# Patient Record
Sex: Female | Born: 1935 | Race: White | Hispanic: No | Marital: Married | Smoking: Never smoker
Health system: Southern US, Community
[De-identification: ages and names within clinical notes are randomized; demographics above are authoritative.]

## PROBLEM LIST (undated history)

## (undated) DIAGNOSIS — I639 Cerebral infarction, unspecified: Secondary | ICD-10-CM

## (undated) DIAGNOSIS — C801 Malignant (primary) neoplasm, unspecified: Secondary | ICD-10-CM

## (undated) DIAGNOSIS — M1990 Unspecified osteoarthritis, unspecified site: Secondary | ICD-10-CM

## (undated) HISTORY — PX: CAROTID ENDARTERECTOMY: SUR193

## (undated) HISTORY — PX: OTHER SURGICAL HISTORY: SHX169

## (undated) HISTORY — DX: Unspecified osteoarthritis, unspecified site: M19.90

## (undated) HISTORY — DX: Cerebral infarction, unspecified: I63.9

## (undated) HISTORY — DX: Malignant (primary) neoplasm, unspecified: C80.1

## (undated) HISTORY — PX: MASTECTOMY: SHX3

## (undated) HISTORY — PX: TUBAL LIGATION: SHX77

---

## 1999-11-06 ENCOUNTER — Encounter: Admission: RE | Admit: 1999-11-06 | Discharge: 2000-02-04 | Payer: Self-pay | Admitting: Radiation Oncology

## 2008-02-12 ENCOUNTER — Ambulatory Visit: Payer: Self-pay | Admitting: *Deleted

## 2008-03-04 ENCOUNTER — Ambulatory Visit: Payer: Self-pay | Admitting: *Deleted

## 2008-03-10 ENCOUNTER — Inpatient Hospital Stay (HOSPITAL_COMMUNITY): Admission: RE | Admit: 2008-03-10 | Discharge: 2008-03-11 | Payer: Self-pay | Admitting: *Deleted

## 2008-03-10 ENCOUNTER — Encounter (INDEPENDENT_AMBULATORY_CARE_PROVIDER_SITE_OTHER): Payer: Self-pay | Admitting: *Deleted

## 2008-03-10 ENCOUNTER — Ambulatory Visit: Payer: Self-pay | Admitting: *Deleted

## 2008-04-08 ENCOUNTER — Ambulatory Visit: Payer: Self-pay | Admitting: *Deleted

## 2008-05-27 ENCOUNTER — Ambulatory Visit: Payer: Self-pay | Admitting: *Deleted

## 2008-09-09 ENCOUNTER — Ambulatory Visit: Payer: Self-pay | Admitting: *Deleted

## 2008-09-16 ENCOUNTER — Encounter: Admission: RE | Admit: 2008-09-16 | Discharge: 2008-09-16 | Payer: Self-pay | Admitting: *Deleted

## 2008-09-16 ENCOUNTER — Ambulatory Visit: Payer: Self-pay | Admitting: *Deleted

## 2008-11-04 ENCOUNTER — Ambulatory Visit: Payer: Self-pay | Admitting: *Deleted

## 2008-11-17 ENCOUNTER — Ambulatory Visit: Payer: Self-pay | Admitting: *Deleted

## 2008-11-17 ENCOUNTER — Inpatient Hospital Stay (HOSPITAL_COMMUNITY): Admission: RE | Admit: 2008-11-17 | Discharge: 2008-11-18 | Payer: Self-pay | Admitting: *Deleted

## 2008-12-16 ENCOUNTER — Ambulatory Visit: Payer: Self-pay | Admitting: *Deleted

## 2009-03-22 ENCOUNTER — Ambulatory Visit: Payer: Self-pay | Admitting: Vascular Surgery

## 2009-10-12 ENCOUNTER — Ambulatory Visit: Payer: Self-pay | Admitting: Vascular Surgery

## 2010-07-04 ENCOUNTER — Ambulatory Visit: Payer: Self-pay | Admitting: Internal Medicine

## 2010-07-11 ENCOUNTER — Ambulatory Visit (HOSPITAL_COMMUNITY): Admission: RE | Admit: 2010-07-11 | Discharge: 2010-07-11 | Payer: Self-pay | Admitting: Internal Medicine

## 2010-07-11 ENCOUNTER — Ambulatory Visit: Payer: Self-pay | Admitting: Internal Medicine

## 2010-09-04 NOTE — Consult Note (Signed)
[name], [name]                ACCOUNT NO.:  [id number]  MEDICAL RECORD NO.:  [id number]          PATIENT TYPE:  AMB  LOCATION:  DAY                           FACILITY:  [facility]  PHYSICIAN:  Lionel December, M.D.    DATE OF BIRTH:  [birth year]  DATE OF CONSULTATION: DATE OF DISCHARGE:  07/11/2010                                CONSULTATION   REASON FOR CONSULTATION:  Heme-positive stool.  Abigail [name] is an 77 year old female, referral from Dr. Sherril Croon for heme- positive stool card on May 23, 2010.  Ms. Hakanson states her weight has been stable for the last 6 months.  She occasionally has acid reflux about 3 times a week.  Her appetite is good.  She also complains of dysphagia.  Foods are lodging especially if she is upset.  No particular foods.  There are no foods in particular that will lodge.  To pass the bolus, she will wait and it would eventually go down or she will drink water and it will pass into her stomach.  She denies any abdominal pain. She has one bowel movement a day.  Stools are dark gray and sometimes they are normal brown.  There is no pain with defecation.  She has seen dark stools in the past.  She denies black, tarry stools.  She denies seeing any bright red rectal bleeding.  She does admit to taking Advil up to 8 a day on most days on a regular basis.  Her last colonoscopy was in 2003 by Dr. Cleotis Nipper, which was normal she reports.  She did, however, have a small polyp.  I will try to obtain the colonoscopy report.  She does complain that her energy level has been down.  ALLERGIES:  She is allergic to [facility] and SULFA.  PAST SURGICAL HISTORY:  She had a lumpectomy in 2000 for breast cancer and in 2004, she underwent a total mastectomy and this was to her left breast.  She has had a tubal ligation, hysterectomy, and she has had disk surgery.  MEDICAL HISTORY:  High cholesterol.  FAMILY HISTORY:  Her mother is deceased from natural causes.  Her father is  deceased from rectal cancer at age 75.  Four sisters, 2 were deceased, one is deceased from uterine cancer and one is deceased from complications of Alzheimer's.  Two sisters are alive and both have pacemakers.  She is married.  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  She does not smoke, drink, or do drugs.  She has 3 children in good health.  HOME MEDICATIONS:  Include aspirin 81 mg a day, Mentax one a day, Allegra 180 mg a day.  OBJECTIVE:  VITAL SIGNS:  Her weight is 108.7, her height is 5 feet 2 inches, her temperature is 97.1, her blood pressure is 132/70, and her pulse is 84. HEENT:  She has natural teeth.  Her oral mucosa is moist.  There is no lesions.  Her conjunctivae is pink.  Her sclerae are anicteric. NECK:  Her thyroid is normal.  There is no cervical lymphadenopathy. LUNGS:  Clear. HEART:  Regular rate and  rhythm. EXTREMITIES:  There is no edema to her extremities.  Her stool was guaiac-negative today and brown.  ASSESSMENT:  Ms. Abigail [name] is an 77 year old female presenting with a history of heme-positive stools.  She has also been taking an excessive amount of Advil.  She also complains of dysphagia and acid reflux. Peptic ulcer disease and an esophageal stricture needs to be ruled out. Also a colonic neoplasm needs to be rule out as well as a colonic ulcer.  RECOMMENDATIONS:  We will get a CBC on her today.  We will get the colonoscopy report from 2003.  We will schedule her for an EGD/ED and a colonoscopy with Dr. Karilyn Cota in the near future and I have discussed the risks and benefits with the patient and she verbalizes an understanding.    ______________________________ Dorene Ar, NP   ______________________________ Lionel December, M.D.    TS/MEDQ  D:  07/04/2010  T:  07/05/2010  Job:  [id number]  cc:   Dr. Sherril Croon  Electronically Signed by Dorene Ar PA on 08/21/2010 10:14:21 AM Electronically Signed by Lionel December M.D. on  09/04/2010 12:00:28 PM

## 2010-10-24 LAB — H. PYLORI ANTIBODY, IGG: H Pylori IgG: <0.4 {ISR}

## 2010-11-03 ENCOUNTER — Other Ambulatory Visit (INDEPENDENT_AMBULATORY_CARE_PROVIDER_SITE_OTHER): Payer: Medicare PPO

## 2010-11-03 ENCOUNTER — Ambulatory Visit (INDEPENDENT_AMBULATORY_CARE_PROVIDER_SITE_OTHER): Payer: Medicare PPO

## 2010-11-03 DIAGNOSIS — I6529 Occlusion and stenosis of unspecified carotid artery: Secondary | ICD-10-CM

## 2010-11-03 DIAGNOSIS — Z48812 Encounter for surgical aftercare following surgery on the circulatory system: Secondary | ICD-10-CM

## 2010-11-03 NOTE — H&P (Signed)
HISTORY AND PHYSICAL EXAMINATION  November 03, 2010  Re:  [place], [place] E                DOB:  January 25, 1925  HISTORY OF PRESENT ILLNESS:  The patient is an 77 year old female who presents today for routine followup of carotid duplex.  She states she feels well and is without complaint.  The patient is status post left brain stroke in 2009 with subsequent left carotid endarterectomy in July 2009 as well as left carotid stenting in April of 2010.  The patient has continued to do well from a neurologic standpoint throughout this time. She denies amaurosis fugax, dysarthria, dysphagia and hemiparesis.  The patient also denies claudication symptoms, chest pain, shortness of breath, nausea, vomiting, diarrhea, constipation and difficulty voiding. The patient's past medical history is significant for breast cancer, arthritis and CVA.  PAST MEDICAL HISTORY: 1. Breast cancer status post left mastectomy. 2. CVA. 3. Arthritis. 4. Spinal disk surgery. 5. Tubal ligation. 6. Left carotid endarterectomy with subsequent left carotid stenting.  ALLERGIES:  Mycin drugs which causes pruritus.  MEDICATIONS: 1. Aspirin 81 mg daily. 2. Multivitamin daily. 3. Allegra daily.  SOCIAL HISTORY:  The patient is married with 3 children.  She is retired.  She does not smoke and does not drink alcohol.  FAMILY HISTORY: 1. Coronary artery disease. 2. Lung cancer. 3. Ovarian cancer. 4. CVA.  REVIEW OF SYSTEMS:  Please see HPI for pertinent positives and negatives, otherwise complete review of systems is negative.  PHYSICAL EXAM:  Vital signs:  Blood pressure 187/85, heart rate 77, respirations 16, O2 saturation 98% on room air.  General:  This is a well-nourished female in no acute distress.  HEENT:  PERRLA.  EOMI. Conjunctivae normal.  Neck:  No bruits are noted.  Lungs:  Clear to auscultation.  Cardiovascular:  Regular rate and rhythm.  Abdomen: Soft, nontender with active bowel  sounds.  Musculoskeletal:  No major deformities or cyanosis are noted.  There are 2+ palpable pulses present in the carotids, radials, femorals and dorsalis pedis bilaterally. Motor and sensation are intact in the bilateral upper and lower extremities.  Neurological:  No focal weakness or paresthesias.  Skin: No ulcers or rashes.  IMAGING:  Carotid duplex performed on 11/03/2010 revealed a 1% to 39% right internal carotid artery stenosis and a widely patent left carotid stent with no evidence of hyperplasia or restenosis.  Bilateral vertebral arteries are within normal limits.  This study is unchanged from the previous evaluation approximately 1 year ago.  ASSESSMENT:  Extracranial cerebrovascular occlusive disease.  PLAN:  The patient is doing well from a carotid standpoint.  She will follow up in 1 year with repeat carotid duplex per protocol.  The patient is to call with any questions, issues or problems in the interim.  Pecola Leisure, [place]  Fransisco Hertz, MD Electronically Signed  AY/MEDQ  D:  11/03/2010  T:  11/03/2010  Job:  [id number]

## 2010-11-11 NOTE — Procedures (Unsigned)
CAROTID DUPLEX EXAM  INDICATION:  Follow up left carotid stent placed 11/2008.  HISTORY: Diabetes:  No. Cardiac:  No. Hypertension:  No. Smoking:  No. Previous Surgery:  Left CEA in 02/2008, left mastectomy. CV History: Amaurosis Fugax No, Paresthesias No, Hemiparesis No.                                      RIGHT             LEFT Brachial systolic pressure:         180               NA Brachial Doppler waveforms:         WNL               NA Vertebral direction of flow:        Antegrade         Antegrade DUPLEX VELOCITIES (cm/sec) CCA peak systolic                   74                94 ECA peak systolic                   69                169 ICA peak systolic                   93                107 (stent) ICA end diastolic                   23                20 (stent) PLAQUE MORPHOLOGY:                  Calcific          NA PLAQUE AMOUNT:                      Minimal           NA PLAQUE LOCATION:                    ICA               NA  IMPRESSION: 1. 1% to 39% right internal carotid artery calcific plaquing. 2. Widely patent left carotid stent without evidence of hyperplasia or     restenosis. 3. Bilateral vertebral arteries are within normal limits.  ___________________________________________ Quita Skye Hart [place], M.D.  LT/MEDQ  D:  11/03/2010  T:  11/03/2010  Job:  [id number]

## 2010-11-22 LAB — BASIC METABOLIC PANEL
CO2: 27 mEq/L (ref 19–32)
Calcium: 8.6 mg/dL (ref 8.4–10.5)
Creatinine, Ser: 0.92 mg/dL (ref 0.4–1.2)
GFR calc Af Amer: >60 mL/min (ref >60)
GFR calc non Af Amer: 59 mL/min — ABNORMAL LOW (ref >60)
Sodium: 139 mEq/L (ref 135–145)

## 2010-11-22 LAB — POCT I-STAT, CHEM 8
BUN: 20 mg/dL (ref 6–23)
Calcium, Ion: 1.13 mmol/L (ref 1.12–1.32)
Chloride: 103 meq/L (ref 96–112)
Creatinine, Ser: 1.2 mg/dL (ref 0.4–1.2)
Glucose, Bld: 106 mg/dL — ABNORMAL HIGH (ref 70–99)
HCT: 45 % (ref 36.0–46.0)
Hemoglobin: 15.3 g/dL — ABNORMAL HIGH (ref 12.0–15.0)
Potassium: 4.4 meq/L (ref 3.5–5.1)
Sodium: 138 meq/L (ref 135–145)
TCO2: 28 mmol/L (ref 0–100)

## 2010-11-22 LAB — CK TOTAL AND CKMB (NOT AT ARMC)
CK, MB: 0.6 ng/mL (ref 0.3–4.0)
Relative Index: INVALID (ref 0.0–2.5)
Total CK: 22 U/L (ref 7–177)

## 2010-11-22 LAB — CBC
MCHC: 33.3 g/dL (ref 30.0–36.0)
RBC: 3.46 MIL/uL — ABNORMAL LOW (ref 3.87–5.11)

## 2010-12-26 NOTE — Assessment & Plan Note (Signed)
OFFICE VISIT   Schoenfeld, Kashonda E  DOB:  08-27-1924                                       03/04/2008  KGMWN#:[id number]   The patient underwent a non-disabling left hemispheric CVA and has an  associated left carotid stenosis.  She was seen in consultation  02/12/2008.  Returns at this time with the results of CT angiogram of th  neck.  This does verify moderate left internal carotid artery stenosis  with eccentric soft plaque noted.   These findings are consistent with her presentation and it is my  recommendation she undergo left carotid endarterectomy for reduction of  stroke risk.  She is agreeable to this, scheduled for 03/10/2008 at  [facility].  She will discontinue Plavix at this time and begin  aspirin 325 mg daily.   Balinda Quails, M.D.  Electronically Signed   PGH/MEDQ  D:  03/04/2008  T:  03/05/2008  Job:  [id number]   cc:   Doreen Beam, MD

## 2010-12-26 NOTE — Assessment & Plan Note (Signed)
OFFICE VISIT   Abigail [name], Abigail [name]  DOB:  May 12, 1925                                       04/08/2008  ZOXWR#:[id number]   The patient underwent left carotid endarterectomy for reduction of  stroke risk on 03/10/2008 at [facility].  She returns today for  scheduled postoperative visit.  No major complaints.  She has noted some  weakness in the left lower lip area, this is related to some traction on  her marginal mandibular nerve.  I have reassured her that this will get  better.   Left neck incision is healing unremarkably.  She is otherwise intact.  Blood pressure is 158/89 with 102 pulse.   PLAN:  Follow up again in approximately 6 weeks to assure there is  continued improvement.   Balinda Quails, M.D.  Electronically Signed   PGH/MEDQ  D:  04/08/2008  T:  04/09/2008  Job:  [id number]   cc:   Doreen Beam, MD

## 2010-12-26 NOTE — Assessment & Plan Note (Signed)
OFFICE VISIT   [name], Abigail E  DOB:  [birth year]                                       09/09/2008  ZOXWR#:[id number]   The patient is an 77 year old female who underwent left carotid  endarterectomy in July of last year.  This was carried out due to a non-  disabling left hemispheric CVA associated with severe left ICA stenosis.   She has done well since her surgery and returned for a follow-up  evaluation with carotid Doppler.  This reveals the endarterectomy site  to be widely patent.  There is, however, an area of stenosis in the  proximal left common carotid artery with a high velocity 397 cm/sec.   This may represent a clamp injury or progression of proximal plaque.  I  have ordered a CT angiogram of the neck to be carried out and return  visit to see me to further evaluate this and make recommendations at  that time.   Balinda Quails, M.D.  Electronically Signed   PGH/MEDQ  D:  09/09/2008  T:  09/10/2008  Job:  [id number]   cc:   Doreen Beam, MD

## 2010-12-26 NOTE — Op Note (Signed)
NAMELINNAE, RASOOL                ACCOUNT NO.:  [id number]   MEDICAL RECORD NO.:  [id number]          PATIENT TYPE:  INP   LOCATION:  2807                         FACILITY:  [facility]   PHYSICIAN:  Balinda Quails, M.D.    DATE OF BIRTH:  10/12/1924   DATE OF PROCEDURE:  DATE OF DISCHARGE:                               OPERATIVE REPORT   SURGEON:  Balinda Quails, MD   ASSISTANT:  Cristy Hilts. Jacinto Halim, MD   PREOPERATIVE DIAGNOSIS:  Left carotid stenosis.   POSTOPERATIVE DIAGNOSIS:  Left carotid stenosis.   PROCEDURE:  1. Arteriogram.  2. Left carotid stent with distal embolic protection.   ACCESS:  Right common femoral artery 6-French sheath.   CONTRAST:  135 mL Visipaque.   COMPLICATIONS:  None apparent.   CLINICAL NOTE:  Abigail [name] is an 77 year old female with a history of  carotid disease and a nondisabling left cerebral stroke.  She had a left  carotid endarterectomy performed in July of 2009 for severe stenosis.  Surveillance Doppler revealed recurrent stenosis of left internal  carotid artery.  This was greater than 80% by Doppler evaluation.  She  was seen preprocedure by Dr. Delia Heady and approved for placement of  a left carotid stent, indication recurrent left carotid stenosis.   PROCEDURE NOTE:  The patient brought to the cath lab in stable  condition.  Placed in supine position.  Both groins prepped and draped  in sterile fashion.  Skin and subcutaneous tissue in right groin was  instilled with 1% Xylocaine.  An 18-gauge needle induced in right common  femoral artery.  A 0.035 guidewire advanced through the needle and a 5-  French sheath advanced over the guidewire.  Dilator removed sheath  flushed with heparin saline solution.   Standard pigtail catheter advanced over the guidewire to the ascending  aorta.  A 40-degree LAO arch aortogram obtained.  This verified distinct  origin of the innominate left common carotid and left subclavian  arteries.  The vertebral  arteries revealed antegrade flow bilaterally.  The right carotid bifurcation revealed no significant stenosis.  Left  carotid bifurcation revealed evidence of a high-grade stenosis.   The guidewire reinserted and a catheter exchange made for Simmons-1  catheter.  The Simmons-1 catheter engaged into the left common carotid  artery.  Left carotid arteriography obtained, cervical and intracranial  views.  This verified a high-grade stenosis at the left carotid  bifurcation.  This was estimated to be 90%.  The distal internal carotid  artery was patent to the base of the skull.  Intracranial views were  obtained.   An Amplatz Super Stiff guidewire then inserted and the right femoral  sheath removed and exchanged for a 6-French shuttle sheath.  The shuttle  sheath advanced into the descending thoracic aorta.  A VTEC catheter was  then advanced over the guidewire.  Using the VTEC catheter and a Bentson  guidewire, the left common carotid artery was again engaged.  With the  left common carotid engaged, the Bentson guidewire removed.  The Amplatz  Super Stiff guidewire reinserted and the  shuttle sheath advanced over  the catheter into the left common carotid artery origin.  The Amplatz  and VTEC catheter was then removed.  The arteriogram obtained.  This  verified the cannulation of the left common carotid artery and  delineated the high-grade stenosis of left carotid bifurcation.   The patient administered Angiomax standard dose with ACT measured 228  seconds.   A 5-mm Angioguard protection device was then advanced across the carotid  bifurcation into the distal left internal carotid artery and deployed.  There was good apposition to the wall.   Predilatation was then carried out with a 3 x 2 maverick balloon, 6 ATMs  x 10 seconds.  Following this, an 8 x 40 precise stent was then advanced  over the guidewire to the area of maximal stenosis and deployed across  the left carotid  bifurcation.  A post dilatation was carried out with an  aviator balloon at 10 atmospheres x 10 seconds.   The patient remained neurologically stable throughout the procedure.  There was no significant hemodynamic compromise, nor bradycardia.   Completion of arteriogram were obtained.  These were unremarkable with  minimal residual stenosis and left carotid bifurcation.  A retrieval  catheter inserted and the protection device removed.  The Wholey  guidewire reinserted and the sheath drawn back out of the left common  carotid artery.  Sheath drawn back into the right external iliac artery,  sheath removed and an exchange made for a short 6-French sheath.  Angiomax discontinued.   There were no apparent complications during the procedure.  Excellent  technical result.   FINAL IMPRESSION:  1. Postoperative recurrent left carotid bifurcation stenosis estimated      to be 90%.  2. Successful left carotid stenting with distal embolic protection      with less than 10% residual stenosis.  3. No apparent complications.      Balinda Quails, M.D.  Electronically Signed     PGH/MEDQ  D:  11/17/2008  T:  11/18/2008  Job:  [id number]   cc:   Pramod P. Pearlean Brownie, MD  Doreen Beam, MD

## 2010-12-26 NOTE — Assessment & Plan Note (Signed)
OFFICE VISIT   [name], Abigail E  DOB:  23-Nov-1924                                       09/16/2008  ZOXWR#:[id number]   The patient underwent a left carotid endarterectomy in July of last  year.  This was initially carried out for a nondisabling left  hemispheric CVA associated with a left ICA stenosis.   She has done well since her surgery.  She returned for followup carotid  Doppler which reveals evidence of a proximal left common carotid artery  stenosis with peak systolic velocity of 397 cm/sec.   She has undergone a CT angiogram of the neck which verifies these  findings.   I do feel she would best be treated with a carotid stent due to the  recurrent nature of the stenosis along with the proximal common carotid  involvement.   Will refer to Dr. Delia Heady for prestent evaluation and await his  opinion and then pursue stenting if he agrees.   Balinda Quails, M.D.  Electronically Signed   PGH/MEDQ  D:  09/16/2008  T:  09/17/2008  Job:  [id number]   cc:   Pramod P. Pearlean Brownie, MD

## 2010-12-26 NOTE — Assessment & Plan Note (Signed)
OFFICE VISIT   Abigail [name], Abigail [name]  DOB:  1924/11/12                                       11/04/2008  JYNWG#:[id number]   The patient underwent left carotid endarterectomy on 03/10/2008.  Her 6  month postoperative Doppler revealed evidence of a high-grade left  common carotid artery stenosis proximal to the operative site.  She has  been seen in consultation by Dr. Pearlean Brownie.  She is a candidate to undergo  potential left carotid stenting with distal protection.  Has a history  of symptomatic left ICA stenosis.   I have explained the details of left carotid stenting.  This has been  scheduled at [facility] on 11/17/2008.  She will begin Plavix  and continue aspirin at this time.   Balinda Quails, M.D.  Electronically Signed   PGH/MEDQ  D:  11/04/2008  T:  11/05/2008  Job:  [id number]   cc:   Pramod P. Pearlean Brownie, MD  Doreen Beam, MD

## 2010-12-26 NOTE — Assessment & Plan Note (Signed)
OFFICE VISIT   Zinda, Rebie E  DOB:  1925-07-25                                       03/22/2009  VZDGL#:[id number]   The patient had a carotid stent placed by Dr. Madilyn Fireman on November 17, 2008,  following initial left carotid endarterectomy July, 2009 with recurrent  stenosis occurring.  She was seen in May and was doing well but now is  having some very nonspecific symptoms, primarily dizziness with  occasional dyspnea on exertion;  however no chest pain or lateralizing  symptoms or symptoms such as hemiparesis, aphasia, amaurosis fugax,  diplopia or blurred vision.  She is taking aspirin on a daily basis.  She is very concerned about this because she had a brother who died  after carotid surgery apparently.   PHYSICAL EXAMINATION:  Blood pressure 196/95, heart rate 98,  respirations 14.  Carotid pulses 3+, no bruits audible.  Neurologic:  Exam is normal.  Upper extremity pulses are 3+ bilaterally.  Chest:  Clear to auscultation.   Carotid duplex exam of the left carotid stent site reveals no evidence  of restenosis in the left carotid artery.  She has a widely patent right  internal carotid by previous duplex scanning.   She is reassured regarding these findings and will follow up with her  medical doctor, Dr. Sherril Croon, since she has had a history of some anemia and  will need a blood count check and possible neurology evaluation if she  continues to have symptoms.  We will continue to follow her on a carotid  stent protocol.   Quita Skye Hart [place], M.D.  Electronically Signed   JDL/MEDQ  D:  03/22/2009  T:  03/23/2009  Job:  [id number]

## 2010-12-26 NOTE — Assessment & Plan Note (Signed)
OFFICE VISIT   [name], Abigail E  DOB:  Nov 27, 1924                                       05/27/2008  XBJYN#:[id number]   The patient underwent left carotid endarterectomy for reduction of  stroke risk on 03/10/2008.  This was carried out for a prior  nondisabling left hemispheric stroke associated with severe left ICA  stenosis.  She developed some left lower lip weakness associated with  traction of her marginal mandibular nerve.   At this time she is doing well.  Marked improvement in the strength of  her left lower lip.  No longer biting this during eating.  Her smile is  almost completely normal now.  Left neck incision well-healed.  Blood  pressure is 140/82, pulse 96 per minute.   Overall doing well following her surgery.  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  This will continue to improve I  am quite confident and completely resolve.  Will plan followup in 6  months with a carotid Doppler.   Balinda Quails, M.D.  Electronically Signed   PGH/MEDQ  D:  05/27/2008  T:  05/28/2008  Job:  [id number]

## 2010-12-26 NOTE — Discharge Summary (Signed)
NAMEJOMARIE, GELLIS                ACCOUNT NO.:  [id number]   MEDICAL RECORD NO.:  [id number]          PATIENT TYPE:  INP   LOCATION:  3305                         FACILITY:  [facility]   PHYSICIAN:  Balinda Quails, M.D.    DATE OF BIRTH:  Jul 04, 1925   DATE OF ADMISSION:  03/10/2008  DATE OF DISCHARGE:  03/11/2008                               DISCHARGE SUMMARY   DISCHARGE DIAGNOSES:  1. Left carotid occlusive disease, symptomatic.  2. Hyperlipidemia.   PROCEDURE PERFORMED:  Left carotid endarterectomy, March 10, 2008.   COMPLICATIONS:  None.   DISCHARGE MEDICATIONS:  1. Plavix 75 mg p.o. daily.  2. Allegra 180 mg p.o. daily.  3. Aspirin 81 mg p.o. daily.  4. Ultram 50 mg p.o. q.4 h. p.r.n. pain, total #20 were given.   DISPOSITION:  She is being discharged home in stable condition with her  wound healing well.  She is to clean the wound with soap and warm water.  She is to observe the wound for drainage, increasing redness, fever  greater than 101.2, swelling, pain or neurological changes.  She is  instructed to increase her activity slowly.  She may shower starting  March 12, 2008.  She should not lift for 3 weeks or drive for 3 weeks.  She is to see Dr. Madilyn Fireman in his office in 2 weeks and office will call  her to arrange a visit.   BRIEF IDENTIFYING STATEMENT:  For complete details, please refer the  typed history and physical.   Briefly, this very pleasant 77 year old was found to have left carotid  occlusive disease following a neurological event which was a non-  disabling left hemispheric CVA.  This was described as right-hand  clumsiness with a feeling of lightheadedness and brief unsteadiness.  She was evaluated by Dr. Madilyn Fireman and recommended left carotid  endarterectomy.  She was informed of the risks and benefits of the  procedure and after careful consideration, elected to proceed with  surgery.   HOSPITAL COURSE:  Preoperative workup was completed as an outpatient.  She was brought in through same-day surgery and underwent the  aforementioned left carotid endarterectomy.  For complete details,  please refer to typed operative report.  The procedure was without  complications.  She was returned to the post-anesthesia care unit,  extubated.  Following stabilization, she was transferred to a bed in a  surgical step-down unit.  She was observed  overnight.  Her activities and diet were advanced.  She was desirous of  discharge on the following morning.  Neurologically, she was nonfocal.  She did have some slight tongue deviation to the left which should clear  up over the next several weeks.  She was stable and was discharged home.      Wilmon Arms, PA      P. Liliane Bade, M.D.  Electronically Signed    KEL/MEDQ  D:  03/11/2008  T:  03/11/2008  Job:  [id number]

## 2010-12-26 NOTE — Procedures (Signed)
CAROTID DUPLEX EXAM   INDICATION:  Follow up left CCA distal stent.   HISTORY:  Diabetes:  No.  Cardiac:  No.  Hypertension:  No.  Smoking:  No.  Previous Surgery:  Left distal CCA stenting.  CV History:  Amaurosis Fugax No, Paresthesias No, Hemiparesis No.                                       RIGHT             LEFT  Brachial systolic pressure:  Brachial Doppler waveforms:  Vertebral direction of flow:                          Antegrade  DUPLEX VELOCITIES (cm/sec)  CCA peak systolic                                     110 (stent)  ECA peak systolic                                     150  ICA peak systolic                                     132  ICA end diastolic                                     40  PLAQUE MORPHOLOGY:                                    Heterogeneous  PLAQUE AMOUNT:                                        Mild  PLAQUE LOCATION:                                      ICA and ECA   IMPRESSION:  1. Limited study.  2. Patent left distal CCA and bifurcation stent with no evidence of      focal stenosis.  3. A 40% to 59% stenosis noted in the left mid ICA.  4. Antegrade left vertebral arteries.     ___________________________________________  P. Liliane Bade, M.D.   MG/MEDQ  D:  12/16/2008  T:  12/16/2008  Job:  [id number]

## 2010-12-26 NOTE — Assessment & Plan Note (Signed)
OFFICE VISIT   Frie, Bibiana E  DOB:  07-Aug-1925                                       12/16/2008  ZOXWR#:[id number]   The patient underwent left carotid stent procedure with distal embolic  protection carried out 11/17/2008 at [facility].  This was  carried out for postoperative recurrent left carotid bifurcation  stenosis of 90%.  This residual stenosis less than 10%.  No  complications associated with the procedure.   Her main complaint at this time is that of fatigue.  I note that her  hemoglobin was 10 on discharge.   BP 190/78, pulse is 92 per minute.   No carotid bruits.  Cranial nerves intact.  Strength equal bilaterally.   Remains on aspirin 81 mg and Plavix has now been completed.   Will increase the aspirin to two baby aspirin daily for 6 weeks.  Then  may return to regular dose.  Plan followup in 6 months with carotid  Doppler evaluation.   Balinda Quails, M.D.  Electronically Signed   PGH/MEDQ  D:  12/16/2008  T:  12/17/2008  Job:  [id number]   cc:   Doreen Beam, MD  Pramod P. Pearlean Brownie, MD

## 2010-12-26 NOTE — Consult Note (Signed)
VASCULAR SURGERY CONSULTATION   Abigail [name], Abigail [name]  DOB:  Jun 18, 1925                                       02/12/2008  RKYHC#:[id number]   REFERRING PHYSICIAN:  Doreen Beam, MD   REASON FOR CONSULTATION:  1. Nondisabling left hemispheric CVA (cerebrovascular accident).  2. Left carotid stenosis.   HISTORY:  The patient is a generally well 77 year old female who  suffered an episode of right hand clumsiness in May of this year.  She  underwent an MRI at that time which revealed evidence of multiple small  acute infarcts in the left MCA distribution.  These are consistent with  embolic event.  Carotid Doppler does reveal a moderate left internal  carotid artery stenosis with velocities 243/85 cm/sec.  Bilateral  vertebral flow was antegrade.  Mild stenosis of the right ICA is  present.  The patient does describe one episode prior to her right hand  clumsiness of a feeling of lightheadedness and unsteadiness briefly.   Her right hand function has improved considerably.  She now feels it is  essentially normal.  No previous history of stroke.   An echocardiogram reveals mild LVH with normal LV function.  Trileaflet  with thickened aortic valve.  Mild mitral regurgitation.  Mild tricuspid  regurgitation with normal right ventricular pressure.   MEDICATIONS:  1. Plavix 75 mg daily.  2. Allegra 180 mg daily.  3. Prevacid 30 mg daily.  4. Femara 2.5 mg daily.  5. Fosamax 70 mg weekly.  6. Calcium plus D 600/200 t.i.d.  7. Naprosyn 500 mg b.i.d. p.r.n.  8. Guaifenesin DM100/10 p.o. q.i.d. p.r.n.  9. Meclizine HCl 25 mg q.i.d. p.r.n.   ALLERGIES:  Mycin drugs.   PAST MEDICAL HISTORY:  Possible hyperlipidemia.   FAMILY HISTORY:  Mother died age 49 of an MI.  Father died age 25 of  cancer.   SOCIAL HISTORY:  The patient is married, has three grown children.  Retired from the [employer] and from [employer].  Does not  smoke or use tobacco products.   No regular alcohol use.   REVIEW OF SYSTEMS:  Refer to patient encounter form.  The patient does  note some loss of appetite although her weight has been stable.  She  also has some arthritic joint discomfort.   PHYSICAL EXAM:  General:  A well-appearing 77 year old female.  Alert  and oriented.  No acute distress.  HEENT:  Mouth and throat are clear.  Normocephalic.  No scleral icterus.  Neck:  Supple.  No thyromegaly,  adenopathy.  Chest:  Equal air entry bilaterally.  No rales or rhonchi.  Cardiovascular:  No carotid bruits.  Heart sounds are normal without  murmurs.  Regular rate and rhythm.  No gallops or rubs.  Abdomen:  Soft,  nontender.  No masses or organomegaly.  Normal bowel sounds.  Extremities:  Full range of motion.  No ankle edema.  No joint  deformity.  Neurological:  Cranial nerves are intact.  The strength in  the right hand is 4/5, left hand 5/5.  Lower extremities:  Normal  strength.  Gait intact.  1+ reflexes bilaterally.  Skin:  Warm, dry,  intact, no ulceration or rash.   IMPRESSION:  1. Nondisabling left hemispheric embolic stroke.  2. Possible hyperlipidemia.  3. Mild valvular heart disease.   RECOMMENDATIONS:  The patient will  be referred for a CT angiogram of the  neck to further evaluate left carotid stenosis.  The plan will likely be  to perform left carotid endarterectomy for reduction of further stroke  risk.   Balinda Quails, M.D.  Electronically Signed  PGH/MEDQ  D:  02/12/2008  T:  02/13/2008  Job:  [id number]   cc:   Doreen Beam, MD

## 2010-12-26 NOTE — Discharge Summary (Signed)
Abigail [name], Abigail [name]                ACCOUNT NO.:  [id number]   MEDICAL RECORD NO.:  [id number]          PATIENT TYPE:  INP   LOCATION:  2506                         FACILITY:  [facility]   PHYSICIAN:  Balinda Quails, M.D.    DATE OF BIRTH:  10/05/1924   DATE OF ADMISSION:  11/17/2008  DATE OF DISCHARGE:  11/18/2008                               DISCHARGE SUMMARY   FINAL DISCHARGE DIAGNOSIS:  Left carotid occlusive disease.   PROCEDURES PERFORMED:  Stenting of left carotid artery.   COMPLICATIONS:  None.   CONDITION ON DISCHARGE:  Stable, improving.   DISCHARGE MEDICATIONS:  She was instructed to resume all previous  medications consisting of:  1. Plavix 75 mg p.o. daily.  2. Allegra p.o. daily.  3. Aspirin 81 mg p.o. daily.   DISPOSITION:  She is being discharged home in stable condition following  a good postoperative recovery from her stent procedure.  She is given an  appointment to see Dr. Madilyn Fireman in 4 weeks with a carotid ultrasound at the  time of evaluation.   BRIEF IDENTIFYING STATEMENT:  For complete details, please refer the  typed history and physical.  Briefly, this 77 year old woman was  evaluated by Dr. Madilyn Fireman for carotid occlusive disease in the left side.  He felt her to be a suitable candidate for stent placement.  She was  informed of the risks and benefits of the procedure and after careful  consideration elected to proceed with surgery.   HOSPITAL COURSE:  Preoperative workup was completed as an outpatient.  She was brought in through same-day surgery and underwent the  aforementioned stenting of her left carotid artery.  For complete  details, please refer the typed operative report.  The procedure was  without complication.  She was returned to the recovery ward bed.  She  was observed over the next 24 hours.  She was stable and was discharged  home.      Wilmon Arms, PA      P. Liliane Bade, M.D.  Electronically Signed    KEL/MEDQ  D:  11/18/2008   T:  11/18/2008  Job:  [id number]

## 2010-12-26 NOTE — Op Note (Signed)
NAMEGEORGENA, Abigail [name]                ACCOUNT NO.:  [id number]   MEDICAL RECORD NO.:  [id number]          PATIENT TYPE:  INP   LOCATION:  3305                         FACILITY:  [facility]   PHYSICIAN:  Balinda Quails, M.D.    DATE OF BIRTH:  03-21-1925   DATE OF PROCEDURE:  03/10/2008  DATE OF DISCHARGE:                               OPERATIVE REPORT   SURGEON:  Balinda Quails, MD   ASSISTANT:  Wilmon Arms, PA   ANESTHETIC:  General endotracheal.   PREOPERATIVE DIAGNOSES:  1. Moderate left internal carotid artery stenosis.  2. Left hemispheric cerebrovascular accident.   POSTOPERATIVE DIAGNOSES:  1. Moderate left internal carotid artery stenosis.  2. Left hemispheric cerebrovascular accident.   PROCEDURE:  Left carotid endarterectomy with Dacron patch angioplasty.   CLINICAL NOTE:  Abigail [name] is a 77 year old female who suffered a non-  disabling left hemispheric CVA and was admitted to [facility] at that time.  She has recovered well and workup for this does  reveal a moderate left internal carotid artery stenosis.  She was  brought to the operating room at this time for planned left carotid  endarterectomy.  Risks of the operative procedure were reviewed prior to  surgery with major morbidity and mortality 1-2% to include but not  limited to MI, CVA, cranial nerve injury or death.   OPERATIVE PROCEDURE:  The patient left the operating room in stable  condition.  Placed under general endotracheal anesthesia.  Foley  catheter and arterial line in place.  Left neck prepped and draped in  sterile fashion.   Curvilinear skin incision made along the anterior border of the left  sternomastoid muscle.  Dissection carried through the subcutaneous  tissue with electrocautery.  Platysma divided.  Facial vein ligated with  2-0 silk and divided.  The carotid bifurcation exposed.  The vagus nerve  identified along with hypoglossal nerve and both were well preserved.   The  carotid bifurcation exposed, the common carotid artery mobilized  down the omohyoid muscle and encircled with a vessel loop.  The origin  of the superior thyroid and external carotid were freed and encircled  with vessel loops.  The internal carotid artery followed distally up to  the posterior belly of the digastric muscle and encircled with a vessel  loop.   The carotid bifurcation revealed moderate plaque disease extending into  the origin of the left internal carotid artery.  The patient  administered 6000 units of heparin intravenously.  Carotid vessels  controlled with clamps.  A longitudinal arteriotomy made in the distal  left common carotid artery.  The arteriotomy extended across the carotid  bulb and up into the internal carotid artery.  Ulcerated plaque present  in the left carotid bulb with a moderate stenosis.  A shunt inserted.   An endarterectomy elevator used to remove the plaque.  The  endarterectomy carried down to the common carotid artery and the plaque  was divided transversely with Potts scissors.  Plaque raised up into the  bulb and the superior thyroid and external carotid were  endarterectomized using an eversion technique, the distal internal  carotid artery plaque feathered out well.   A patch angioplasty endarterectomy site was then carried out with a  Finesse Dacron patch using running 6-0 Prolene suture.  Shunt was  removed.  All vessels well flushed.  Clamps removed directing initial  antegrade flow up the external carotid artery, following this the  internal carotids were released.   Excellent pulse and Doppler signal in the distal internal carotid  artery.  The patient administered 50 mg of protamine intravenously.  Sponges, needle and instrument counts were correct.  Adequate hemostasis  obtained.   Closure carried out in layers with a running 2-0 Vicryl suture in the  sternomastoid fascia, and a 3-0 Vicryl suture in the subcutaneous  platysma  layer, 4-0 Monocryl in the skin and Dermabond applied.   The patient tolerated the procedure well.  No apparent complications.  Transferred to the recovery room in stable condition.  Neurologically  intact.      Balinda Quails, M.D.  Electronically Signed     PGH/MEDQ  D:  03/10/2008  T:  03/11/2008  Job:  [id number]   cc:   Doreen Beam, MD

## 2010-12-26 NOTE — Procedures (Signed)
CAROTID DUPLEX EXAM   INDICATION:  Postop carotid endarterectomy.   HISTORY:  Diabetes:  No.  Cardiac:  No.  Hypertension:  No.  Smoking:  No.  Previous Surgery:  Left carotid endarterectomy on 03/10/08 by Dr. Madilyn Fireman.  CV History:  Yes.  Amaurosis Fugax No, Paresthesias No, Hemiparesis No.                                       RIGHT             LEFT  Brachial systolic pressure:         146  Brachial Doppler waveforms:         Triphasic  Vertebral direction of flow:        Antegrade         Antegrade  DUPLEX VELOCITIES (cm/sec)  CCA peak systolic                   88                397  ECA peak systolic                   77                41  ICA peak systolic                   86                88  ICA end diastolic                   27                29  PLAQUE MORPHOLOGY:                  Calcified         Homogenous  PLAQUE AMOUNT:                      Mild              Severe  PLAQUE LOCATION:                    BIF, ICA          CCA distal   IMPRESSION:  1. 20-39% right internal carotid artery stenosis.  2. Patent left internal carotid artery with no evidence of restenosis.  3. 80-99% stenosis of the left distal common carotid artery.   ___________________________________________  P. Liliane Bade, M.D.   AC/MEDQ  D:  09/09/2008  T:  09/09/2008  Job:  [id number]

## 2010-12-26 NOTE — Procedures (Signed)
CAROTID DUPLEX EXAM   INDICATION:  Follow up left CCA distal/proximal ICA stent.   HISTORY:  Diabetes:  No.  Cardiac:  No.  Hypertension:  No.  Smoking:  No.  Previous Surgery:  Left distal CCA/proximal ICA stent.  CV History:  Increased unsteadiness.  Stroke previously.  Amaurosis Fugax No, Paresthesias No, Hemiparesis No.                                       RIGHT             LEFT  Brachial systolic pressure:  Brachial Doppler waveforms:  Vertebral direction of flow:                          Antegrade  DUPLEX VELOCITIES (cm/sec)  CCA peak systolic                                     97 (stent)  ECA peak systolic                                     131  ICA peak systolic                                     122  ICA end diastolic                                     37  PLAQUE MORPHOLOGY:                                    Heterogenous  PLAQUE AMOUNT:                                        Mild  PLAQUE LOCATION:                                      ICA/ECA   IMPRESSION:  1. Limited left side only study.  2. Patent left distal common carotid artery/proximal internal carotid      artery stent with no evidence of focal stenosis.  3. Velocities distal to stent in the mid internal carotid artery are      suggestive of 40-59% stenosis (low end of range).  4. No significant changes from previous study.   ___________________________________________  Quita Skye Hart [place], M.D.   AS/MEDQ  D:  03/22/2009  T:  03/22/2009  Job:  [id number]

## 2010-12-26 NOTE — Procedures (Signed)
CAROTID DUPLEX EXAM   INDICATION:  Follow up carotid artery disease.   HISTORY:  Diabetes:  No.  Cardiac:  No.  Hypertension:  No.  Smoking:  No.  Previous Surgery:  Left distal CCA/proximal ICA stent.  CV History:  CVA.  Amaurosis Fugax No, Paresthesias No, Hemiparesis No.                                       RIGHT             LEFT  Brachial systolic pressure:         152               Mastectomy  Brachial Doppler waveforms:         WNL  Vertebral direction of flow:        Antegrade         Antegrade  DUPLEX VELOCITIES (cm/sec)  CCA peak systolic                   78                100  ECA peak systolic                   83                103  ICA peak systolic                   86                111  ICA end diastolic                   27                26  PLAQUE MORPHOLOGY:                  Calcific          Heterogenous  PLAQUE AMOUNT:                      Mild              Mild  PLAQUE LOCATION:                    ICA, bulb         ICA, ECA   IMPRESSION:  1. Right internal carotid artery suggests 20-39% stenosis.  2. Left internal carotid artery suggests 20-39% stenosis.  3. Antegrade flow in bilateral vertebrals.   ___________________________________________  Quita Skye Hart [place], M.D.   CB/MEDQ  D:  10/12/2009  T:  10/12/2009  Job:  [id number]

## 2011-05-11 LAB — CBC
HCT: 30.7 — ABNORMAL LOW
HCT: 38
Hemoglobin: 10.3 — ABNORMAL LOW
Hemoglobin: 12.6
MCHC: 33
MCHC: 33.4
MCV: 91.4
Platelets: 232
RBC: 4.16
RDW: 14.1
RDW: 14.9
WBC: 5.6

## 2011-05-11 LAB — BASIC METABOLIC PANEL
BUN: 18
Calcium: 9.4
GFR calc non Af Amer: 55 — ABNORMAL LOW
Glucose, Bld: 101 — ABNORMAL HIGH
Sodium: 139

## 2011-05-11 LAB — BASIC METABOLIC PANEL WITH GFR
BUN: 12
CO2: 26
Calcium: 8.4
Chloride: 104
Creatinine, Ser: 0.87
GFR calc non Af Amer: >60
Glucose, Bld: 135 — ABNORMAL HIGH
Potassium: 3.9
Sodium: 136

## 2011-05-11 LAB — TYPE AND SCREEN
ABO/RH(D): B NEG
Antibody Screen: NEGATIVE

## 2011-05-11 LAB — URINALYSIS, ROUTINE W REFLEX MICROSCOPIC
Bilirubin Urine: NEGATIVE
Ketones, ur: NEGATIVE
Nitrite: NEGATIVE
Protein, ur: NEGATIVE
Urobilinogen, UA: 0.2

## 2011-05-11 LAB — APTT: aPTT: 24

## 2011-05-11 LAB — PROTIME-INR: INR: 0.9

## 2011-11-07 ENCOUNTER — Ambulatory Visit (INDEPENDENT_AMBULATORY_CARE_PROVIDER_SITE_OTHER): Payer: Medicare PPO | Admitting: *Deleted

## 2011-11-07 DIAGNOSIS — I6529 Occlusion and stenosis of unspecified carotid artery: Secondary | ICD-10-CM

## 2011-11-07 DIAGNOSIS — Z48812 Encounter for surgical aftercare following surgery on the circulatory system: Secondary | ICD-10-CM

## 2011-11-14 ENCOUNTER — Encounter: Payer: Self-pay | Admitting: Vascular Surgery

## 2011-11-14 ENCOUNTER — Other Ambulatory Visit: Payer: Self-pay | Admitting: *Deleted

## 2011-11-14 DIAGNOSIS — I6529 Occlusion and stenosis of unspecified carotid artery: Secondary | ICD-10-CM

## 2011-11-14 DIAGNOSIS — Z48812 Encounter for surgical aftercare following surgery on the circulatory system: Secondary | ICD-10-CM

## 2011-11-14 NOTE — Procedures (Unsigned)
CAROTID DUPLEX EXAM  INDICATION:  Follow up left carotid stent.  HISTORY: Diabetes:  No Cardiac:  No Hypertension:  No Smoking:  No Previous Surgery:  Left CEA 02/2008 with stent placed 11/2008 Amaurosis Fugax No, Paresthesias No, Hemiparesis No                                      RIGHT             LEFT Brachial systolic pressure:         150               mastectomy Brachial Doppler waveforms:         WNL Vertebral direction of flow:        antegrade         abnormal DUPLEX VELOCITIES (cm/sec) CCA peak systolic                   67                92 ECA peak systolic                   72                128 ICA peak systolic                   72                91 (stent) ICA end diastolic                   18                21 (stent) PLAQUE MORPHOLOGY:                  heterogeneous PLAQUE AMOUNT:                      minimal           N/A PLAQUE LOCATION:                    ICA  IMPRESSION: 1. A 1% to 39% plaquing of the right internal carotid artery. 2. Widely patent left carotid stent without evidence of hyperplasia or     restenosis. 3. Left vertebral artery displays midsystolic deceleration which was     not previously documented and may indicate pre-steal syndrome. 4. Left subclavian stenosis is observed. 5. Left brachial pressure could not be obtained due to mastectomy. 6.  ___________________________________________ Quita Skye. Hart [place], M.D.  LT/MEDQ  D:  11/07/2011  T:  11/07/2011  Job:  [id number]

## 2012-05-01 ENCOUNTER — Other Ambulatory Visit: Payer: Self-pay | Admitting: Dermatology

## 2012-11-14 ENCOUNTER — Ambulatory Visit: Payer: Medicare PPO | Admitting: Neurosurgery

## 2012-11-14 ENCOUNTER — Other Ambulatory Visit: Payer: Medicare PPO

## 2012-12-26 ENCOUNTER — Encounter: Payer: Self-pay | Admitting: Vascular Surgery

## 2013-01-19 ENCOUNTER — Other Ambulatory Visit: Payer: Self-pay | Admitting: *Deleted

## 2013-01-19 DIAGNOSIS — Z48812 Encounter for surgical aftercare following surgery on the circulatory system: Secondary | ICD-10-CM

## 2013-01-26 ENCOUNTER — Encounter: Payer: Self-pay | Admitting: Vascular Surgery

## 2013-01-27 ENCOUNTER — Other Ambulatory Visit: Payer: Medicare PPO

## 2013-01-27 ENCOUNTER — Ambulatory Visit: Payer: Medicare PPO | Admitting: Vascular Surgery

## 2013-04-27 ENCOUNTER — Encounter: Payer: Self-pay | Admitting: Vascular Surgery

## 2013-04-28 ENCOUNTER — Ambulatory Visit: Payer: Medicare PPO | Admitting: Vascular Surgery

## 2013-04-28 ENCOUNTER — Other Ambulatory Visit: Payer: Medicare PPO

## 2013-06-08 ENCOUNTER — Encounter: Payer: Self-pay | Admitting: Vascular Surgery

## 2013-06-09 ENCOUNTER — Ambulatory Visit (HOSPITAL_COMMUNITY)
Admission: RE | Admit: 2013-06-09 | Discharge: 2013-06-09 | Disposition: A | Discharge status: Home / Self Care | Payer: Medicare Other | Source: Ambulatory Visit | Attending: Vascular Surgery | Admitting: Vascular Surgery

## 2013-06-09 ENCOUNTER — Ambulatory Visit (INDEPENDENT_AMBULATORY_CARE_PROVIDER_SITE_OTHER): Payer: Medicare Other | Admitting: Vascular Surgery

## 2013-06-09 ENCOUNTER — Encounter: Payer: Self-pay | Admitting: Vascular Surgery

## 2013-06-09 DIAGNOSIS — I6529 Occlusion and stenosis of unspecified carotid artery: Secondary | ICD-10-CM

## 2013-06-09 DIAGNOSIS — Z48812 Encounter for surgical aftercare following surgery on the circulatory system: Secondary | ICD-10-CM | POA: Insufficient documentation

## 2013-06-09 NOTE — Progress Notes (Signed)
Subjective:     Patient ID: Abigail [name], female   DOB: May 16, 1925, 79 y.o.   MRN: [id number]  HPI this 79 year old female status post left carotid endarterectomy by Dr. Georganna Skeans in 2009 and required a stent in the left common carotid artery in 2010. She did have a mild right brain stroke over the past few years. This consisted of some mild left facial weakness. She has had no neurologic symptoms since then. She denies aphasia, amaurosis fugax, diplopia, blurred vision, or syncope.  Past Medical History  Diagnosis Date  . Cancer     breast  . CVA (cerebral infarction)   . Arthritis     History  Substance Use Topics  . Smoking status: Never Smoker   . Smokeless tobacco: Never Used  . Alcohol Use: No    History reviewed. No pertinent family history.  Allergies  Allergen Reactions  . Erythromycin     Current outpatient prescriptions:aspirin 81 MG tablet, Take 81 mg by mouth daily., Disp: , Rfl: ;  Fexofenadine HCl (ALLEGRA PO), Take by mouth daily., Disp: , Rfl:   BP 158/83  Pulse 87  Ht 5\' 2"  (1.575 m)  Wt 96 lb 12.8 oz (43.908 kg)  BMI 17.7 kg/m2  SpO2 97%  Body mass index is 17.7 kg/(m^2).          Review of Systems denies chest pain, dyspnea on exertion, PND, orthopnea, hemoptysis. Does walk with a walker has some mild instability. Other systems negative and complete review of systems    Objective:   Physical Exam BP 158/83  Pulse 87  Ht 5\' 2"  (1.575 m)  Wt 96 lb 12.8 oz (43.908 kg)  BMI 17.7 kg/m2  SpO2 97%  Gen.-alert and oriented x3 in no apparent distress HEENT normal for age Lungs no rhonchi or wheezing Cardiovascular regular rhythm no murmurs carotid pulses 3+ palpable no bruits audible Abdomen soft nontender no palpable masses Musculoskeletal free of  major deformities Skin clear -no rashes Neurologic normal Lower extremities 3+ femoral and dorsalis pedis pulses palpable bilaterally with no edema  Today I ordered a carotid duplex exam which I  reviewed and interpreted. There is no evidence of any flow reduction in either internal carotid or common carotid artery. There is some evidence of some stenosis in the proximal left subclavian artery with mostly retrograde flow in the left vertebral artery but antegrade flow in the right vertebral.      Assessment:     Status post left carotid endarterectomy in 2009 and left carotid stent in 2010-currently asymptomatic doing well but does have a history of right brain CVA    Plan:     Return in one year with carotid duplex exam and see nurse practitioner less-developed neurologic symptoms in the interim

## 2013-06-10 NOTE — Addendum Note (Signed)
Addended by: Sharee Pimple on: 06/10/2013 08:55 AM   Modules accepted: Orders

## 2013-08-13 DIAGNOSIS — I639 Cerebral infarction, unspecified: Secondary | ICD-10-CM

## 2013-08-13 HISTORY — DX: Cerebral infarction, unspecified: I63.9

## 2014-06-14 ENCOUNTER — Encounter: Payer: Self-pay | Admitting: Family

## 2014-06-15 ENCOUNTER — Ambulatory Visit (INDEPENDENT_AMBULATORY_CARE_PROVIDER_SITE_OTHER): Payer: Medicare Other | Admitting: Family

## 2014-06-15 ENCOUNTER — Other Ambulatory Visit: Payer: Self-pay | Admitting: Vascular Surgery

## 2014-06-15 ENCOUNTER — Ambulatory Visit (HOSPITAL_COMMUNITY)
Admission: RE | Admit: 2014-06-15 | Discharge: 2014-06-15 | Disposition: A | Discharge status: Home / Self Care | Payer: Medicare Other | Source: Ambulatory Visit | Attending: Family | Admitting: Family

## 2014-06-15 ENCOUNTER — Encounter: Payer: Self-pay | Admitting: Family

## 2014-06-15 VITALS — BP 102/62 | HR 81 | Resp 16 | Ht 61.0 in | Wt 109.0 lb

## 2014-06-15 DIAGNOSIS — I6523 Occlusion and stenosis of bilateral carotid arteries: Secondary | ICD-10-CM | POA: Diagnosis not present

## 2014-06-15 DIAGNOSIS — Z48812 Encounter for surgical aftercare following surgery on the circulatory system: Secondary | ICD-10-CM

## 2014-06-15 DIAGNOSIS — I6529 Occlusion and stenosis of unspecified carotid artery: Secondary | ICD-10-CM | POA: Insufficient documentation

## 2014-06-15 DIAGNOSIS — I6522 Occlusion and stenosis of left carotid artery: Secondary | ICD-10-CM

## 2014-06-15 NOTE — Progress Notes (Signed)
Established Carotid Patient   History of Present Illness  Abigail [name] is a 80 y.o. female patient of Dr. Kellie Simmering who is status post left carotid endarterectomy by Dr. Erskin Burnet in 2009 and required a stent in the left common carotid artery in 2010. She did have a mild right brain stroke over the past few years. This consisted of some mild left facial weakness.  She also had a TIA in January, 2015, hospitalized at [facility], as manifested by left hemiparesis, left facial drooping, slurred speech; these symptoms resolved in about a month; patient denies any history of monocular blindness; daughter states her blood pressure was a little high at that time, has since decreased; daughter states that her mother followed up with her PCP following this. The patient denies tingling, numbness, weakness, or pain in either hand or arm.  She returns today for follow up. The patient states she has a little problem with memory.  The patient denies other New Medical or Surgical History.  Pt Diabetic: No Pt smoker: non-smoker  Pt meds include: Statin : No ASA: Yes Other anticoagulants/antiplatelets: no   Past Medical History  Diagnosis Date  . Cancer     breast  . CVA (cerebral infarction)   . Arthritis   . Stroke Jan. 2015    Right side    Social History History  Substance Use Topics  . Smoking status: Never Smoker   . Smokeless tobacco: Never Used  . Alcohol Use: No    Family History Family History  Problem Relation Age of Onset  . Cancer Father     Colon  . Cancer Sister     Ovarian    Surgical History Past Surgical History  Procedure Laterality Date  . Carotid endarterectomy      left  . Tubal ligation    . Spinal disk surgery    . Mastectomy Left     Allergies  Allergen Reactions  . Erythromycin Rash    Current Outpatient Prescriptions  Medication Sig Dispense Refill  . aspirin 81 MG tablet Take 81 mg by mouth daily.    . calcium acetate (PHOSLO) 667  MG capsule Take 600 mg by mouth daily.    Marland Kitchen donepezil (ARICEPT) 10 MG tablet Take 10 mg by mouth every evening.    Marland Kitchen Fexofenadine HCl (ALLEGRA PO) Take by mouth daily.    . Multiple Vitamin (MULTIVITAMIN) tablet Take 1 tablet by mouth daily. 50 Plus     No current facility-administered medications for this visit.    Review of Systems : See HPI for pertinent positives and negatives.  Physical Examination  Filed Vitals:   06/15/14 1531 06/15/14 1537  BP: 155/77 102/62  Pulse: 81 81  Resp:  16  Height:  5\' 1"  (1.549 m)  Weight:  109 lb (49.442 kg)  SpO2:  100%   Body mass index is 20.61 kg/(m^2).  General: WDWN female in NAD GAIT: normal Eyes: PERRLA Pulmonary:  Non-labored, CTAB, Negative  Rales, Negative rhonchi, & Negative wheezing.  Cardiac: regular Rhythm,  Negative detected murmur.  VASCULAR EXAM Carotid Bruits Right Left   Negative Positive, soft    Aorta is not palpable. Radial pulses: right is 2+ palpable, left is 1+ palpable.  LE Pulses Right Left       POPLITEAL  not palpable   not palpable       POSTERIOR TIBIAL  not palpable   not palpable        DORSALIS PEDIS      ANTERIOR TIBIAL 2+ palpable  not palpable     Gastrointestinal: soft, nontender, BS WNL, no r/g,  negative palpated masses.  Musculoskeletal: Negative muscle atrophy/wasting. M/S 5/5 throughout, Extremities without ischemic changes.  Neurologic: A&O X 3; Appropriate Affect ; SENSATION ;normal;  Speech is normal CN 2-12 intact, Pain and light touch intact in extremities, Motor exam as listed above.   Non-Invasive Vascular Imaging CAROTID DUPLEX 06/15/2014   CEREBROVASCULAR DUPLEX EVALUATION     INDICATION: Carotid stenosis    PREVIOUS INTERVENTION(S): Left carotid endarterectomy 02/2008; Left carotid stent placed 11/2008.    DUPLEX EXAM:     RIGHT  LEFT  Peak  Systolic Velocities (cm/s) End Diastolic Velocities (cm/s) Plaque LOCATION Peak Systolic Velocities (cm/s) End Diastolic Velocities (cm/s) Plaque  52 9 HT CCA PROXIMAL 60 13   83 19 HT CCA MID 100 23   82 18 HT CCA DISTAL STENT STENT   58 8 HT ECA 68 0   93 25 HT ICA PROXIMAL STENT STENT   76 21  ICA MID 76 19   103 26  ICA DISTAL 99 22     1.12 ICA / CCA Ratio (PSV) carotid endarterectomy/stent  Antegrade Vertebral Flow Retrograde  NA Brachial Systolic Pressure (mmHg) Mastectomy  Triphasic Brachial Artery Waveforms Monophasic    Peak Systolic Velocities (cm/s) End Diastolic Velocities (cm/s) Plaque STENT (LCCA-LICA ) Peak Systolic Velocities (cm/s) End Diastolic Velocities (cm/s) Plaque     PROXIMAL 80 18      MID 89 18      DISTAL 94 20     Plaque Morphology:  HM = Homogeneous, HT = Heterogeneous, CP = Calcific Plaque, SP = Smooth Plaque, IP = Irregular Plaque    ADDITIONAL FINDINGS:     IMPRESSION: Right internal carotid artery stenosis present of less than 40%. Patent left distal common carotid artery to proximal internal carotid artery stent also with history of carotid endarterectomy, no hyperplasia or hemodynamically significant stenosis present. Monophasic left subclavian artery Doppler waveform with retrograde left vertebral artery flow suggestive of subclavian steal syndrome, no blood pressures obtained due to history of mastectomy.    Compared to the previous exam:  Essentially unchanged since previous studies on 06/09/2013 and 11/07/2011.     Assessment: Abigail [name] is a 80 y.o. female who is s/p left carotid endarterectomy 02/2008, Left carotid stent placed 11/2008. She has a history of a mild right brain stroke over the past few years. This consisted of some mild left facial weakness.  She also had a TIA in January, 2015, hospitalized at [facility], as manifested by left hemiparesis, left facial drooping, slurred speech; these symptoms resolved in about a  month; patient denies any history of monocular blindness; daughter states her blood pressure was a little high at that time, has since decreased; daughter states that her mother followed up with her PCP following this. About this time she was feeling stressed from her husband's illness and subsequent death. The patient denies tingling, numbness, weakness, or pain in either hand or arm. Today's carotid Duplex reveals a right internal carotid artery stenosis present of less than 40%. Patent left distal common carotid artery to proximal internal carotid artery stent also with history of  carotid endarterectomy, no hyperplasia or hemodynamically significant stenosis present. Monophasic left subclavian artery Doppler waveform with retrograde left vertebral artery flow suggestive of subclavian steal syndrome, no blood pressures obtained due to history of mastectomy. Essentially unchanged since previous studies on 06/09/2013 and 11/07/2011.    Plan: Follow-up in 1 year with Carotid Duplex scan.   I discussed in depth with the patient the nature of atherosclerosis, and emphasized the importance of maximal medical management including strict control of blood pressure, blood glucose, and lipid levels, obtaining regular exercise, and continued cessation of smoking.  The patient is aware that without maximal medical management the underlying atherosclerotic disease process will progress, limiting the benefit of any interventions. The patient was given information about stroke prevention and what symptoms should prompt the patient to seek immediate medical care. Thank you for allowing [place] to participate in this patient's care.  Clemon Chambers, RN, MSN, FNP-C [facility] Office: [facility] Physician: Kellie Simmering  06/15/2014 3:45 PM

## 2014-06-15 NOTE — Patient Instructions (Signed)
Stroke Prevention Some medical conditions and behaviors are associated with an increased chance of having a stroke. You may prevent a stroke by making healthy choices and managing medical conditions. HOW CAN I REDUCE MY RISK OF HAVING A STROKE?   Stay physically active. Get at least 30 minutes of activity on most or all days.  Do not smoke. It may also be helpful to avoid exposure to secondhand smoke.  Limit alcohol use. Moderate alcohol use is considered to be:  No more than 2 drinks per day for men.  No more than 1 drink per day for nonpregnant women.  Eat healthy foods. This involves:  Eating 5 or more servings of fruits and vegetables a day.  Making dietary changes that address high blood pressure (hypertension), high cholesterol, diabetes, or obesity.  Manage your cholesterol levels.  Making food choices that are high in fiber and low in saturated fat, trans fat, and cholesterol may control cholesterol levels.  Take any prescribed medicines to control cholesterol as directed by your health care provider.  Manage your diabetes.  Controlling your carbohydrate and sugar intake is recommended to manage diabetes.  Take any prescribed medicines to control diabetes as directed by your health care provider.  Control your hypertension.  Making food choices that are low in salt (sodium), saturated fat, trans fat, and cholesterol is recommended to manage hypertension.  Take any prescribed medicines to control hypertension as directed by your health care provider.  Maintain a healthy weight.  Reducing calorie intake and making food choices that are low in sodium, saturated fat, trans fat, and cholesterol are recommended to manage weight.  Stop drug abuse.  Avoid taking birth control pills.  Talk to your health care provider about the risks of taking birth control pills if you are over 35 years old, smoke, get migraines, or have ever had a blood clot.  Get evaluated for sleep  disorders (sleep apnea).  Talk to your health care provider about getting a sleep evaluation if you snore a lot or have excessive sleepiness.  Take medicines only as directed by your health care provider.  For some people, aspirin or blood thinners (anticoagulants) are helpful in reducing the risk of forming abnormal blood clots that can lead to stroke. If you have the irregular heart rhythm of atrial fibrillation, you should be on a blood thinner unless there is a good reason you cannot take them.  Understand all your medicine instructions.  Make sure that other conditions (such as anemia or atherosclerosis) are addressed. SEEK IMMEDIATE MEDICAL CARE IF:   You have sudden weakness or numbness of the face, arm, or leg, especially on one side of the body.  Your face or eyelid droops to one side.  You have sudden confusion.  You have trouble speaking (aphasia) or understanding.  You have sudden trouble seeing in one or both eyes.  You have sudden trouble walking.  You have dizziness.  You have a loss of balance or coordination.  You have a sudden, severe headache with no known cause.  You have new chest pain or an irregular heartbeat. Any of these symptoms may represent a serious problem that is an emergency. Do not wait to see if the symptoms will go away. Get medical help at once. Call your local emergency services (911 in [place] Do not drive yourself to the hospital. Document Released: 09/06/2004 Document Revised: 12/14/2013 Document Reviewed: 01/30/2013 [employer] Patient Information 2015 [employer]. This information is not intended to replace advice given   to you by your health care provider. Make sure you discuss any questions you have with your health care provider.  

## 2015-06-27 ENCOUNTER — Other Ambulatory Visit (HOSPITAL_COMMUNITY): Payer: Self-pay

## 2015-06-27 ENCOUNTER — Ambulatory Visit: Payer: Self-pay | Admitting: Family

## 2015-06-28 ENCOUNTER — Other Ambulatory Visit (HOSPITAL_COMMUNITY): Payer: Medicare Other

## 2015-06-28 ENCOUNTER — Ambulatory Visit: Payer: Medicare Other | Admitting: Family

## 2015-08-03 ENCOUNTER — Encounter: Payer: Self-pay | Admitting: Family

## 2015-08-11 ENCOUNTER — Ambulatory Visit: Payer: Self-pay | Admitting: Family

## 2015-08-11 ENCOUNTER — Encounter (HOSPITAL_COMMUNITY): Payer: Self-pay

## 2015-08-12 ENCOUNTER — Encounter: Payer: Self-pay | Admitting: Family

## 2015-08-12 ENCOUNTER — Ambulatory Visit (HOSPITAL_COMMUNITY)
Admission: RE | Admit: 2015-08-12 | Discharge: 2015-08-12 | Disposition: A | Discharge status: Home / Self Care | Payer: Medicare Other | Source: Ambulatory Visit | Attending: Family | Admitting: Family

## 2015-08-12 ENCOUNTER — Ambulatory Visit (INDEPENDENT_AMBULATORY_CARE_PROVIDER_SITE_OTHER): Payer: Medicare Other | Admitting: Family

## 2015-08-12 ENCOUNTER — Other Ambulatory Visit: Payer: Self-pay | Admitting: Family

## 2015-08-12 VITALS — BP 144/72 | HR 79 | Ht 61.0 in | Wt 113.1 lb

## 2015-08-12 DIAGNOSIS — Z48812 Encounter for surgical aftercare following surgery on the circulatory system: Secondary | ICD-10-CM | POA: Diagnosis not present

## 2015-08-12 DIAGNOSIS — I6523 Occlusion and stenosis of bilateral carotid arteries: Secondary | ICD-10-CM | POA: Insufficient documentation

## 2015-08-12 DIAGNOSIS — Z9889 Other specified postprocedural states: Secondary | ICD-10-CM | POA: Insufficient documentation

## 2015-08-12 DIAGNOSIS — Z959 Presence of cardiac and vascular implant and graft, unspecified: Secondary | ICD-10-CM

## 2015-08-12 NOTE — Progress Notes (Signed)
Chief Complaint: Extracranial Carotid Artery Stenosis   History of Present Illness  Abigail [name] is a 81 y.o. female patient of Dr. Kellie Simmering who is status post left carotid endarterectomy by Dr. Drucie Opitz in 2009 and required a stent in the left common carotid artery in 2010.   She had a TIA in January, 2015, hospitalized at [facility], as manifested by left hemiparesis, left facial drooping, slurred speech; these symptoms resolved in about a month; patient denies any history of monocular blindness; daughter states her blood pressure was a little high at that time, has since decreased; daughter states that her mother followed up with her PCP following this. The patient denies tingling, numbness, weakness, or pain in either hand or arm.  She returns today for follow up. The patient states she has a little problem with memory.  The patient denies other New Medical or Surgical History.  Pt Diabetic: No Pt smoker: non-smoker  Pt meds include: Statin : yes ASA: Yes Other anticoagulants/antiplatelets: no   Past Medical History  Diagnosis Date  . Cancer ([place])     breast  . CVA (cerebral infarction)   . Arthritis   . Stroke [facility]) Jan. 2015    Right side    Social History Social History  Substance Use Topics  . Smoking status: Never Smoker   . Smokeless tobacco: Never Used  . Alcohol Use: No    Family History Family History  Problem Relation Age of Onset  . Cancer Father     Colon  . Cancer Sister     Ovarian    Surgical History Past Surgical History  Procedure Laterality Date  . Carotid endarterectomy      left  . Tubal ligation    . Spinal disk surgery    . Mastectomy Left     Allergies  Allergen Reactions  . Erythromycin Rash    Current Outpatient Prescriptions  Medication Sig Dispense Refill  . aspirin 81 MG tablet Take 81 mg by mouth daily.    Marland Kitchen atorvastatin (LIPITOR) 10 MG tablet   3  . calcium acetate (PHOSLO) 667 MG capsule Take 600 mg  by mouth daily.    . citalopram (CELEXA) 10 MG tablet   2  . donepezil (ARICEPT) 10 MG tablet Take 10 mg by mouth every evening.    Marland Kitchen Fexofenadine HCl (ALLEGRA PO) Take by mouth daily.    . Multiple Vitamin (MULTIVITAMIN) tablet Take 1 tablet by mouth daily. 50 Plus     No current facility-administered medications for this visit.    Review of Systems : See HPI for pertinent positives and negatives.  Physical Examination  Filed Vitals:   08/12/15 1539 08/12/15 1540  BP: 186/76 144/72  Pulse: 79   Height: 5\' 1"  (1.549 m)   Weight: 113 lb 1.6 oz (51.302 kg)   SpO2: 100%    Body mass index is 21.38 kg/(m^2).   General: WDWN female in NAD GAIT: normal Eyes: PERRLA Pulmonary: Non-labored, CTAB, Negative Rales, Negative rhonchi, & Negative wheezing.  Cardiac: regular Rhythm, Negative detected murmur.  VASCULAR EXAM Carotid Bruits Right Left   Negative Positive, soft   Aorta is not palpable. Radial pulses: right is 2+ palpable, left is 1+ palpable.      LE Pulses Right Left   POPLITEAL not palpable  not palpable   POSTERIOR TIBIAL not palpable  not palpable    DORSALIS PEDIS  ANTERIOR TIBIAL faintly palpable  not palpable     Gastrointestinal:  soft, nontender, BS WNL, no r/g, negative palpated masses.  Musculoskeletal: Negative muscle atrophy/wasting. M/S 5/5 throughout, Extremities without ischemic changes.  Neurologic: A&O X 3; Appropriate Affect ; SENSATION ;normal;  Speech is normal CN 2-12 intact, Pain and light touch intact in extremities, Motor exam as listed above.           Non-Invasive Vascular Imaging CAROTID DUPLEX 08/12/2015   CEREBROVASCULAR DUPLEX EVALUATION     INDICATION: Carotid artery disease; evaluation status post stent placement.    PREVIOUS INTERVENTION(S):  Left carotid endarterectomy 02/2008; left carotid stent placed 11/2008    DUPLEX EXAM:     RIGHT  LEFT  Peak Systolic Velocities (cm/s) End Diastolic Velocities (cm/s) Plaque LOCATION Peak Systolic Velocities (cm/s) End Diastolic Velocities (cm/s) Plaque  48 9 HT CCA PROXIMAL 62 13 -  91 18 HT CCA MID 75 18 HT  103 19 HT CCA DISTAL Stent - -  87 11 HT ECA 45 4 -  115 21 HT ICA PROXIMAL Stent - -  62 19 - ICA MID 90 23 -  88 21 - ICA DISTAL 76 17 -    1.2 ICA / CCA Ratio (PSV) N/A  Antegrade Vertebral Flow Retrograde  0000000 Brachial Systolic Pressure (mmHg) Mastectomy  B Brachial Artery Waveforms M    Peak Systolic Velocities (cm/s) End Diastolic Velocities (cm/s) Plaque STENT (  ) Peak Systolic Velocities (cm/s) End Diastolic Velocities (cm/s) Plaque     PROXIMAL 80 18      MID 79 16      DISTAL 112 14     Plaque Morphology:  HM = Homogeneous, HT = Heterogeneous, CP = Calcific Plaque, SP = Smooth Plaque, IP = Irregular Plaque    ADDITIONAL FINDINGS:     IMPRESSION: 1. Less than 40% right internal carotid artery stenosis. 2. Patent left distal common carotid artery to proximal internal carotid artery stent ( history of carotid endarterectomy) with less than 40% internal carotid artery stenosis. 3. Known left subclavian steal.    Compared to the previous exam:  No significant change since exam of 06/15/2014      Assessment: Abigail [name] is a 81 y.o. female  who is status post left carotid endarterectomy by Dr. Drucie Opitz in 2009 and required a stent in the left common carotid artery in 2010.   She had a TIA in January, 2015, no subsequent TIA or stroke. Today's carotid duplex suggests less than 40% right internal carotid artery stenosis. Patent left distal common carotid artery to proximal internal carotid artery stent ( history of carotid endarterectomy) with less than 40% internal carotid artery stenosis. Known left subclavian steal. No significant change since exam of  06/15/2014.    Plan: Follow-up in 1 year with Carotid Duplex scan.   I discussed in depth with the patient the nature of atherosclerosis, and emphasized the importance of maximal medical management including strict control of blood pressure, blood glucose, and lipid levels, obtaining regular exercise, and continued cessation of smoking.  The patient is aware that without maximal medical management the underlying atherosclerotic disease process will progress, limiting the benefit of any interventions. The patient was given information about stroke prevention and what symptoms should prompt the patient to seek immediate medical care. Thank you for allowing [place] to participate in this patient's care.  Clemon Chambers, RN, MSN, FNP-C [facility] [place] Office: [phone]  Clinic Physician: Bridgett Larsson on call  08/12/2015 3:40 PM

## 2015-08-12 NOTE — Patient Instructions (Signed)
Stroke Prevention Some medical conditions and behaviors are associated with an increased chance of having a stroke. You may prevent a stroke by making healthy choices and managing medical conditions. HOW CAN I REDUCE MY RISK OF HAVING A STROKE?   Stay physically active. Get at least 30 minutes of activity on most or all days.  Do not smoke. It may also be helpful to avoid exposure to secondhand smoke.  Limit alcohol use. Moderate alcohol use is considered to be:  No more than 2 drinks per day for men.  No more than 1 drink per day for nonpregnant women.  Eat healthy foods. This involves:  Eating 5 or more servings of fruits and vegetables a day.  Making dietary changes that address high blood pressure (hypertension), high cholesterol, diabetes, or obesity.  Manage your cholesterol levels.  Making food choices that are high in fiber and low in saturated fat, trans fat, and cholesterol may control cholesterol levels.  Take any prescribed medicines to control cholesterol as directed by your health care provider.  Manage your diabetes.  Controlling your carbohydrate and sugar intake is recommended to manage diabetes.  Take any prescribed medicines to control diabetes as directed by your health care provider.  Control your hypertension.  Making food choices that are low in salt (sodium), saturated fat, trans fat, and cholesterol is recommended to manage hypertension.  Ask your health care provider if you need treatment to lower your blood pressure. Take any prescribed medicines to control hypertension as directed by your health care provider.  If you are 18-39 years of age, have your blood pressure checked every 3-5 years. If you are 40 years of age or older, have your blood pressure checked every year.  Maintain a healthy weight.  Reducing calorie intake and making food choices that are low in sodium, saturated fat, trans fat, and cholesterol are recommended to manage  weight.  Stop drug abuse.  Avoid taking birth control pills.  Talk to your health care provider about the risks of taking birth control pills if you are over 35 years old, smoke, get migraines, or have ever had a blood clot.  Get evaluated for sleep disorders (sleep apnea).  Talk to your health care provider about getting a sleep evaluation if you snore a lot or have excessive sleepiness.  Take medicines only as directed by your health care provider.  For some people, aspirin or blood thinners (anticoagulants) are helpful in reducing the risk of forming abnormal blood clots that can lead to stroke. If you have the irregular heart rhythm of atrial fibrillation, you should be on a blood thinner unless there is a good reason you cannot take them.  Understand all your medicine instructions.  Make sure that other conditions (such as anemia or atherosclerosis) are addressed. SEEK IMMEDIATE MEDICAL CARE IF:   You have sudden weakness or numbness of the face, arm, or leg, especially on one side of the body.  Your face or eyelid droops to one side.  You have sudden confusion.  You have trouble speaking (aphasia) or understanding.  You have sudden trouble seeing in one or both eyes.  You have sudden trouble walking.  You have dizziness.  You have a loss of balance or coordination.  You have a sudden, severe headache with no known cause.  You have new chest pain or an irregular heartbeat. Any of these symptoms may represent a serious problem that is an emergency. Do not wait to see if the symptoms will   go away. Get medical help at once. Call your local emergency services (911 in [place] Do not drive yourself to the hospital.   This information is not intended to replace advice given to you by your health care provider. Make sure you discuss any questions you have with your health care provider.   Document Released: 09/06/2004 Document Revised: 08/20/2014 Document Reviewed:  01/30/2013 Elsevier Interactive Patient Education 2016 [employer]  

## 2015-10-03 ENCOUNTER — Ambulatory Visit: Payer: Self-pay | Admitting: Family

## 2015-10-03 ENCOUNTER — Encounter (HOSPITAL_COMMUNITY): Payer: Self-pay

## 2016-08-02 ENCOUNTER — Encounter: Payer: Self-pay | Admitting: Family

## 2016-08-09 ENCOUNTER — Other Ambulatory Visit: Payer: Self-pay

## 2016-08-09 DIAGNOSIS — I6523 Occlusion and stenosis of bilateral carotid arteries: Secondary | ICD-10-CM

## 2016-08-09 DIAGNOSIS — Z48812 Encounter for surgical aftercare following surgery on the circulatory system: Secondary | ICD-10-CM

## 2016-08-14 ENCOUNTER — Ambulatory Visit (HOSPITAL_COMMUNITY): Payer: Medicare Other

## 2016-08-14 ENCOUNTER — Ambulatory Visit: Payer: Medicare Other | Admitting: Family

## 2016-09-14 ENCOUNTER — Encounter: Payer: Self-pay | Admitting: Internal Medicine

## 2016-10-03 ENCOUNTER — Encounter: Payer: Self-pay | Admitting: Family

## 2016-10-09 ENCOUNTER — Ambulatory Visit (INDEPENDENT_AMBULATORY_CARE_PROVIDER_SITE_OTHER): Payer: Medicare Other | Admitting: Family

## 2016-10-09 ENCOUNTER — Encounter: Payer: Self-pay | Admitting: Family

## 2016-10-09 ENCOUNTER — Ambulatory Visit (HOSPITAL_COMMUNITY)
Admission: RE | Admit: 2016-10-09 | Discharge: 2016-10-09 | Disposition: A | Discharge status: Home / Self Care | Payer: Medicare Other | Source: Ambulatory Visit | Attending: Vascular Surgery | Admitting: Vascular Surgery

## 2016-10-09 VITALS — BP 141/67 | HR 69 | Temp 97.8°F | Resp 18 | Ht 61.0 in | Wt 117.4 lb

## 2016-10-09 DIAGNOSIS — I6523 Occlusion and stenosis of bilateral carotid arteries: Secondary | ICD-10-CM

## 2016-10-09 DIAGNOSIS — Z95828 Presence of other vascular implants and grafts: Secondary | ICD-10-CM | POA: Insufficient documentation

## 2016-10-09 DIAGNOSIS — Z48812 Encounter for surgical aftercare following surgery on the circulatory system: Secondary | ICD-10-CM | POA: Diagnosis not present

## 2016-10-09 DIAGNOSIS — Z959 Presence of cardiac and vascular implant and graft, unspecified: Secondary | ICD-10-CM | POA: Diagnosis not present

## 2016-10-09 DIAGNOSIS — Z9889 Other specified postprocedural states: Secondary | ICD-10-CM | POA: Diagnosis not present

## 2016-10-09 DIAGNOSIS — I6521 Occlusion and stenosis of right carotid artery: Secondary | ICD-10-CM | POA: Diagnosis not present

## 2016-10-09 LAB — VAS US CAROTID
Left CCA prox dias: 15 cm/s
Left CCA prox sys: 69 cm/s
Left ICA dist dias: -19 cm/s
Left ICA dist sys: -78 cm/s
RIGHT CCA MID DIAS: 15 cm/s
RIGHT ECA DIAS: -7 cm/s
RIGHT VERTEBRAL DIAS: 12 cm/s
Right CCA prox dias: 7 cm/s
Right CCA prox sys: 47 cm/s
Right cca dist sys: -126 cm/s

## 2016-10-09 NOTE — Patient Instructions (Signed)
Stroke Prevention Some medical conditions and behaviors are associated with an increased chance of having a stroke. You may prevent a stroke by making healthy choices and managing medical conditions. How can I reduce my risk of having a stroke?  Stay physically active. Get at least 30 minutes of activity on most or all days.  Do not smoke. It may also be helpful to avoid exposure to secondhand smoke.  Limit alcohol use. Moderate alcohol use is considered to be:  No more than 2 drinks per day for men.  No more than 1 drink per day for nonpregnant women.  Eat healthy foods. This involves:  Eating 5 or more servings of fruits and vegetables a day.  Making dietary changes that address high blood pressure (hypertension), high cholesterol, diabetes, or obesity.  Manage your cholesterol levels.  Making food choices that are high in fiber and low in saturated fat, trans fat, and cholesterol may control cholesterol levels.  Take any prescribed medicines to control cholesterol as directed by your health care provider.  Manage your diabetes.  Controlling your carbohydrate and sugar intake is recommended to manage diabetes.  Take any prescribed medicines to control diabetes as directed by your health care provider.  Control your hypertension.  Making food choices that are low in salt (sodium), saturated fat, trans fat, and cholesterol is recommended to manage hypertension.  Ask your health care provider if you need treatment to lower your blood pressure. Take any prescribed medicines to control hypertension as directed by your health care provider.  If you are 18-39 years of age, have your blood pressure checked every 3-5 years. If you are 40 years of age or older, have your blood pressure checked every year.  Maintain a healthy weight.  Reducing calorie intake and making food choices that are low in sodium, saturated fat, trans fat, and cholesterol are recommended to manage  weight.  Stop drug abuse.  Avoid taking birth control pills.  Talk to your health care provider about the risks of taking birth control pills if you are over 35 years old, smoke, get migraines, or have ever had a blood clot.  Get evaluated for sleep disorders (sleep apnea).  Talk to your health care provider about getting a sleep evaluation if you snore a lot or have excessive sleepiness.  Take medicines only as directed by your health care provider.  For some people, aspirin or blood thinners (anticoagulants) are helpful in reducing the risk of forming abnormal blood clots that can lead to stroke. If you have the irregular heart rhythm of atrial fibrillation, you should be on a blood thinner unless there is a good reason you cannot take them.  Understand all your medicine instructions.  Make sure that other conditions (such as anemia or atherosclerosis) are addressed. Get help right away if:  You have sudden weakness or numbness of the face, arm, or leg, especially on one side of the body.  Your face or eyelid droops to one side.  You have sudden confusion.  You have trouble speaking (aphasia) or understanding.  You have sudden trouble seeing in one or both eyes.  You have sudden trouble walking.  You have dizziness.  You have a loss of balance or coordination.  You have a sudden, severe headache with no known cause.  You have new chest pain or an irregular heartbeat. Any of these symptoms may represent a serious problem that is an emergency. Do not wait to see if the symptoms will go away.   Get medical help at once. Call your local emergency services (911 in [place] Do not drive yourself to the hospital. This information is not intended to replace advice given to you by your health care provider. Make sure you discuss any questions you have with your health care provider. Document Released: 09/06/2004 Document Revised: 01/05/2016 Document Reviewed: 01/30/2013 Elsevier  Interactive Patient Education  2017 [employer].  

## 2016-10-09 NOTE — Progress Notes (Signed)
Chief Complaint: Follow up Extracranial Carotid Artery Stenosis   History of Present Illness  Abigail [name] is a 83 y.o. female patient of Dr. Kellie Simmering who is status post left carotid endarterectomy by Dr. Drucie Opitz in 2009 and required a stent in the left common carotid artery in 2010.   She had a TIA in January, 2015, hospitalized at [facility], as manifested by left hemiparesis, left facial drooping, slurred speech; these symptoms resolved in about a month; patient denies any history of monocular blindness; daughter states her blood pressure was a little high at that time, has since decreased; daughter states that her mother followed up with her PCP following this. The patient denies tingling, numbness, weakness, or pain in either hand or arm.  She has a remote history of left mastectomy, had radiation and chemotherapy afterward.   She returns today for follow up. The patient states she has a little problem with memory.  The patient denies other New Medical or Surgical History.  Pt Diabetic: No Pt smoker: non-smoker  Pt meds include: Statin : yes ASA: Yes Other anticoagulants/antiplatelets: no    Past Medical History:  Diagnosis Date  . Arthritis   . Cancer ([place])    breast  . CVA (cerebral infarction)   . Stroke [facility]) Jan. 2015   Right side    Social History Social History  Substance Use Topics  . Smoking status: Never Smoker  . Smokeless tobacco: Never Used  . Alcohol use No    Family History Family History  Problem Relation Age of Onset  . Cancer Father     Colon  . Cancer Sister     Ovarian    Surgical History Past Surgical History:  Procedure Laterality Date  . CAROTID ENDARTERECTOMY     left  . MASTECTOMY Left   . spinal disk surgery    . TUBAL LIGATION      Allergies  Allergen Reactions  . Erythromycin Rash    Current Outpatient Prescriptions  Medication Sig Dispense Refill  . aspirin 81 MG tablet Take 81 mg by mouth daily.     Marland Kitchen atorvastatin (LIPITOR) 10 MG tablet   3  . calcium acetate (PHOSLO) 667 MG capsule Take 600 mg by mouth daily.    . citalopram (CELEXA) 10 MG tablet   2  . donepezil (ARICEPT) 10 MG tablet Take 10 mg by mouth every evening.    Marland Kitchen Fexofenadine HCl (ALLEGRA PO) Take by mouth daily.    . Multiple Vitamin (MULTIVITAMIN) tablet Take 1 tablet by mouth daily. 50 Plus     No current facility-administered medications for this visit.     Review of Systems : See HPI for pertinent positives and negatives.  Physical Examination  Vitals:   10/09/16 1543  BP: (!) 141/67  Pulse: 69  Resp: 18  Temp: 97.8 F (36.6 C)  TempSrc: Oral  SpO2: 96%  Weight: 117 lb 6.4 oz (53.3 kg)  Height: 5\' 1"  (1.549 m)   Body mass index is 22.18 kg/m.  General: WDWN female in NAD GAIT: normal Eyes: PERRLA Pulmonary: Respirations are non-labored, CTAB, good air movement in all fields.  Cardiac: regular rhythm,no detected murmur.  VASCULAR EXAM Carotid Bruits Right Left   Negative Positive   Aorta is not palpable. Radial pulses: right is 2+ palpable bilaterally      LE Pulses Right Left   POPLITEAL not palpable  not palpable   POSTERIOR TIBIAL not palpable  not palpable  DORSALIS PEDIS  ANTERIOR TIBIAL faintly palpable  not palpable     Gastrointestinal: soft, nontender, BS WNL, no r/g,no palpated masses.  Musculoskeletal: No muscle atrophy/wasting. M/S 5/5 throughout, Extremities without ischemic changes.  Neurologic: A&O X 3; Appropriate Affect ; SENSATION ;normal;  Speech is normal CN 2-12 intact except is hard of hearing, Pain and light touch intact in extremities, Motor exam as listed above.     Assessment: Abigail [name] is a 83 y.o. female who is status post left carotid endarterectomy by Dr. Drucie Opitz  in 2009 and required a stent in the left common carotid artery in 2010.   She had a TIA in January, 2015, no subsequent TIA or stroke.  Fortunately she does not have DM and has never used tobacco.    DATA Today's carotid duplex suggests less than 40% right internal carotid artery stenosis. Patent left distal common carotid artery to proximal internal carotid artery stent ( history of carotid endarterectomy) with no restenosis. Right vertebral artery flow is antegrade, left is retrograde. Right subclavian artery waveforms are biphasic, left is monophasic.  No significant change since exams of 06/15/2014 and 08-12-15.    Plan: Follow-up in 1 year with Carotid Duplex scan.    I discussed in depth with the patient the nature of atherosclerosis, and emphasized the importance of maximal medical management including strict control of blood pressure, blood glucose, and lipid levels, obtaining regular exercise, and continued cessation of smoking.  The patient is aware that without maximal medical management the underlying atherosclerotic disease process will progress, limiting the benefit of any interventions. The patient was given information about stroke prevention and what symptoms should prompt the patient to seek immediate medical care. Thank you for allowing [place] to participate in this patient's care.  Clemon Chambers, RN, MSN, FNP-C [facility] Office: [phone]  Clinic Physician: Early  10/09/16 3:51 PM

## 2016-10-17 NOTE — Addendum Note (Signed)
Addended by: Lianne Cure A on: 10/17/2016 04:34 PM   Modules accepted: Orders

## 2017-09-11 ENCOUNTER — Encounter (HOSPITAL_COMMUNITY): Payer: Self-pay | Admitting: Emergency Medicine

## 2017-09-11 ENCOUNTER — Emergency Department (HOSPITAL_COMMUNITY): Payer: Medicare Other

## 2017-09-11 ENCOUNTER — Observation Stay (HOSPITAL_COMMUNITY)
Admission: EM | Admit: 2017-09-11 | Discharge: 2017-09-12 | Disposition: A | Discharge status: Home / Self Care | Payer: Medicare Other | Attending: Medical | Admitting: Medical

## 2017-09-11 ENCOUNTER — Other Ambulatory Visit: Payer: Self-pay

## 2017-09-11 DIAGNOSIS — R2681 Unsteadiness on feet: Secondary | ICD-10-CM | POA: Insufficient documentation

## 2017-09-11 DIAGNOSIS — J181 Lobar pneumonia, unspecified organism: Principal | ICD-10-CM

## 2017-09-11 DIAGNOSIS — Z79899 Other long term (current) drug therapy: Secondary | ICD-10-CM | POA: Diagnosis not present

## 2017-09-11 DIAGNOSIS — Z7982 Long term (current) use of aspirin: Secondary | ICD-10-CM | POA: Diagnosis not present

## 2017-09-11 DIAGNOSIS — F0151 Vascular dementia with behavioral disturbance: Secondary | ICD-10-CM

## 2017-09-11 DIAGNOSIS — F05 Delirium due to known physiological condition: Secondary | ICD-10-CM

## 2017-09-11 DIAGNOSIS — W19XXXA Unspecified fall, initial encounter: Secondary | ICD-10-CM

## 2017-09-11 DIAGNOSIS — R1310 Dysphagia, unspecified: Secondary | ICD-10-CM | POA: Insufficient documentation

## 2017-09-11 DIAGNOSIS — R410 Disorientation, unspecified: Secondary | ICD-10-CM

## 2017-09-11 DIAGNOSIS — Y92009 Unspecified place in unspecified non-institutional (private) residence as the place of occurrence of the external cause: Secondary | ICD-10-CM | POA: Diagnosis not present

## 2017-09-11 DIAGNOSIS — F0150 Vascular dementia without behavioral disturbance: Secondary | ICD-10-CM | POA: Diagnosis not present

## 2017-09-11 DIAGNOSIS — J189 Pneumonia, unspecified organism: Secondary | ICD-10-CM | POA: Diagnosis present

## 2017-09-11 LAB — CBC WITH DIFFERENTIAL/PLATELET
Basophils Absolute: 0 10*3/uL (ref 0.0–0.1)
Basophils Relative: 1 %
EOS PCT: 4 %
Eosinophils Absolute: 0.2 10*3/uL (ref 0.0–0.7)
HCT: 40.1 % (ref 36.0–46.0)
Hemoglobin: 12.6 g/dL (ref 12.0–15.0)
LYMPHS ABS: 1.2 10*3/uL (ref 0.7–4.0)
LYMPHS PCT: 25 %
MCH: 29.9 pg (ref 26.0–34.0)
MCHC: 31.4 g/dL (ref 30.0–36.0)
MCV: 95 fL (ref 78.0–100.0)
MONO ABS: 1.1 10*3/uL — AB (ref 0.1–1.0)
MONOS PCT: 22 %
Neutro Abs: 2.3 10*3/uL (ref 1.7–7.7)
Neutrophils Relative %: 48 %
PLATELETS: 215 10*3/uL (ref 150–400)
RBC: 4.22 MIL/uL (ref 3.87–5.11)
RDW: 12.5 % (ref 11.5–15.5)
WBC: 4.7 10*3/uL (ref 4.0–10.5)

## 2017-09-11 LAB — URINALYSIS, ROUTINE W REFLEX MICROSCOPIC
Bilirubin Urine: NEGATIVE
Glucose, UA: NEGATIVE mg/dL
Hgb urine dipstick: NEGATIVE
Ketones, ur: 5 mg/dL — AB
Leukocytes, UA: NEGATIVE
NITRITE: NEGATIVE
PROTEIN: NEGATIVE mg/dL
SPECIFIC GRAVITY, URINE: 1.023 (ref 1.005–1.030)
pH: 5 (ref 5.0–8.0)

## 2017-09-11 LAB — COMPREHENSIVE METABOLIC PANEL
ALT: 14 U/L (ref 14–54)
AST: 23 U/L (ref 15–41)
Albumin: 4 g/dL (ref 3.5–5.0)
Alkaline Phosphatase: 60 U/L (ref 38–126)
Anion gap: 10 (ref 5–15)
BUN: 18 mg/dL (ref 6–20)
CHLORIDE: 100 mmol/L — AB (ref 101–111)
CO2: 26 mmol/L (ref 22–32)
CREATININE: 1.06 mg/dL — AB (ref 0.44–1.00)
Calcium: 9.5 mg/dL (ref 8.9–10.3)
GFR, EST AFRICAN AMERICAN: 52 mL/min — AB (ref >60)
GFR, EST NON AFRICAN AMERICAN: 45 mL/min — AB (ref >60)
Glucose, Bld: 101 mg/dL — ABNORMAL HIGH (ref 65–99)
POTASSIUM: 4.3 mmol/L (ref 3.5–5.1)
Sodium: 136 mmol/L (ref 135–145)
Total Bilirubin: 0.6 mg/dL (ref 0.3–1.2)
Total Protein: 7.7 g/dL (ref 6.5–8.1)

## 2017-09-11 LAB — TROPONIN I

## 2017-09-11 LAB — LIPASE, BLOOD: LIPASE: 43 U/L (ref 11–51)

## 2017-09-11 MED ORDER — CALCIUM ACETATE (PHOS BINDER) 667 MG PO CAPS
667.0000 mg | ORAL_CAPSULE | Freq: Every day | ORAL | Status: DC
  Administered 2017-09-12: 667 mg via ORAL
  Filled 2017-09-11: qty 1

## 2017-09-11 MED ORDER — SODIUM CHLORIDE 0.9 % IV BOLUS (SEPSIS)
500.0000 mL | Freq: Once | INTRAVENOUS | Status: AC
  Administered 2017-09-11: 500 mL via INTRAVENOUS

## 2017-09-11 MED ORDER — ATORVASTATIN CALCIUM 10 MG PO TABS
10.0000 mg | ORAL_TABLET | Freq: Every day | ORAL | Status: DC
  Administered 2017-09-11: 10 mg via ORAL
  Filled 2017-09-11: qty 1

## 2017-09-11 MED ORDER — VITAMIN E 180 MG (400 UNIT) PO CAPS
400.0000 [IU] | ORAL_CAPSULE | Freq: Every day | ORAL | Status: DC
  Administered 2017-09-11 – 2017-09-12 (×2): 400 [IU] via ORAL
  Filled 2017-09-11 (×2): qty 1

## 2017-09-11 MED ORDER — ONDANSETRON HCL 4 MG PO TABS: 4.0000 mg | ORAL_TABLET | Freq: Four times a day (QID) | ORAL | Status: DC | PRN

## 2017-09-11 MED ORDER — ACETAMINOPHEN 650 MG RE SUPP: 650.0000 mg | Freq: Four times a day (QID) | RECTAL | Status: DC | PRN

## 2017-09-11 MED ORDER — ONDANSETRON HCL 4 MG/2ML IJ SOLN: 4.0000 mg | Freq: Four times a day (QID) | INTRAMUSCULAR | Status: DC | PRN

## 2017-09-11 MED ORDER — GUAIFENESIN ER 600 MG PO TB12
600.0000 mg | ORAL_TABLET | Freq: Two times a day (BID) | ORAL | Status: DC
  Administered 2017-09-11 – 2017-09-12 (×3): 600 mg via ORAL
  Filled 2017-09-11 (×3): qty 1

## 2017-09-11 MED ORDER — ASPIRIN 81 MG PO CHEW
81.0000 mg | CHEWABLE_TABLET | Freq: Every day | ORAL | Status: DC
  Administered 2017-09-11 – 2017-09-12 (×2): 81 mg via ORAL
  Filled 2017-09-11 (×2): qty 1

## 2017-09-11 MED ORDER — SODIUM CHLORIDE 0.9 % IV SOLN
INTRAVENOUS | Status: DC
  Administered 2017-09-11 (×2): via INTRAVENOUS

## 2017-09-11 MED ORDER — DOXYCYCLINE HYCLATE 100 MG PO TABS
100.0000 mg | ORAL_TABLET | Freq: Once | ORAL | Status: AC
Start: 2017-09-11 — End: 2017-09-11
  Administered 2017-09-11: 100 mg via ORAL
  Filled 2017-09-11: qty 1

## 2017-09-11 MED ORDER — CRANBERRY 125 MG PO TABS: 1.0000 | ORAL_TABLET | Freq: Every day | ORAL | Status: DC

## 2017-09-11 MED ORDER — DEXTROSE 5 % IV SOLN
1.0000 g | Freq: Once | INTRAVENOUS | Status: AC
  Administered 2017-09-11: 1 g via INTRAVENOUS
  Filled 2017-09-11: qty 10

## 2017-09-11 MED ORDER — ACETAMINOPHEN 325 MG PO TABS: 650.0000 mg | ORAL_TABLET | Freq: Four times a day (QID) | ORAL | Status: DC | PRN

## 2017-09-11 MED ORDER — DOXYCYCLINE HYCLATE 100 MG PO TABS
100.0000 mg | ORAL_TABLET | Freq: Two times a day (BID) | ORAL | Status: DC
  Administered 2017-09-11 – 2017-09-12 (×2): 100 mg via ORAL
  Filled 2017-09-11 (×2): qty 1

## 2017-09-11 MED ORDER — DONEPEZIL HCL 5 MG PO TABS
10.0000 mg | ORAL_TABLET | Freq: Every evening | ORAL | Status: DC
  Administered 2017-09-11: 10 mg via ORAL
  Filled 2017-09-11: qty 2

## 2017-09-11 MED ORDER — CITALOPRAM HYDROBROMIDE 20 MG PO TABS
10.0000 mg | ORAL_TABLET | Freq: Every day | ORAL | Status: DC
  Administered 2017-09-11 – 2017-09-12 (×2): 10 mg via ORAL
  Filled 2017-09-11 (×2): qty 1

## 2017-09-11 MED ORDER — DEXTROSE 5 % IV SOLN
1.0000 g | INTRAVENOUS | Status: DC
  Administered 2017-09-12: 1 g via INTRAVENOUS
  Filled 2017-09-11 (×2): qty 10

## 2017-09-11 MED ORDER — QUETIAPINE FUMARATE 25 MG PO TABS
25.0000 mg | ORAL_TABLET | Freq: Two times a day (BID) | ORAL | Status: DC | PRN
  Administered 2017-09-12: 25 mg via ORAL
  Filled 2017-09-11: qty 1

## 2017-09-11 MED ORDER — SODIUM CHLORIDE 0.9% FLUSH
3.0000 mL | Freq: Two times a day (BID) | INTRAVENOUS | Status: DC
  Administered 2017-09-12: 3 mL via INTRAVENOUS

## 2017-09-11 MED ORDER — ENOXAPARIN SODIUM 40 MG/0.4ML ~~LOC~~ SOLN
40.0000 mg | SUBCUTANEOUS | Status: DC
  Administered 2017-09-11: 40 mg via SUBCUTANEOUS
  Filled 2017-09-11: qty 0.4

## 2017-09-11 MED ORDER — ADULT MULTIVITAMIN W/MINERALS CH
1.0000 | ORAL_TABLET | Freq: Every day | ORAL | Status: DC
  Administered 2017-09-12: 1 via ORAL
  Filled 2017-09-11 (×2): qty 1

## 2017-09-11 NOTE — Evaluation (Signed)
Physical Therapy Evaluation Patient Details Name: Abigail [name] MRN: [id number] DOB: 1925/07/30 Today's Date: 09/11/2017   History of Present Illness  Kimoni Pickerill  is a 84 y.o. female, with a history of multiple TIAs/stroke with vascular dementia who lives at home with her daughter was brought to the ED for cough with productive phlegm for past 3 days associated with increased confusion.  Patient had a fall at home 3 days back without loss of consciousness or sustaining any injury.  Since then she has been unsteady and increasingly confused, more during the evening and wandering around the house.  Patient also choked on her food 3 days back.  Daughter denies any fevers, chills, nausea, vomiting, chest pain, shortness of breath, abdominal pain, dysuria or diarrhea.  Denies any sick contact, recent URI symptoms or travel.  At baseline she has moderate to severe dementia (identifies faces and able to carry b few basic conversation), ambulates with some support and holding onto some objects or onto the wall.  Clinical Impression  Ms. Skellenger has a history of dementia.  She will obey commands however.  During ambulation with the rolling walker Ms. Vanderhoef demonstrated multiple points of unsteadiness but was able to self correct each time.  Ms. Porco will benefit from home health therapy to work on her balance to decrease her risk of falling.  Due to her dementia I would recommend 24 hour supervision.     Follow Up Recommendations Home health PT;Supervision/Assistance - 24 hour    Equipment Recommendations  None recommended by PT    Recommendations for Other Services   none    Precautions / Restrictions Precautions Precautions: Fall Restrictions Weight Bearing Restrictions: No      Mobility  Bed Mobility Overal bed mobility: Modified Independent                Transfers Overall transfer level: Needs assistance Equipment used: Rolling walker (2 wheeled) Transfers: Sit to/from Stand Sit  to Stand: Modified independent (Device/Increase time)            Ambulation/Gait Ambulation/Gait assistance: Min guard Ambulation Distance (Feet): 160 Feet Assistive device: Rolling walker (2 wheeled) Gait Pattern/deviations: Step-through pattern   Gait velocity interpretation: at or above normal speed for age/gender        Balance Overall balance assessment: Needs assistance   Sitting balance-Leahy Scale: Fair     Standing balance support: Single extremity supported Standing balance-Leahy Scale: Fair                               Pertinent Vitals/Pain Pain Assessment: No/denies pain    Home Living Family/patient expects to be discharged to:: Private residence Living Arrangements: Children Available Help at Discharge: Family;Available PRN/intermittently Type of Home: House       Home Layout: Able to live on main level with bedroom/bathroom Home Equipment: Gilford Rile - 2 wheels      Prior Function Level of Independence: Needs assistance   Gait / Transfers Assistance Needed: supervison  ADL's / Homemaking Assistance Needed: min to mod  assist        Hand Dominance        Extremity/Trunk Assessment        Lower Extremity Assessment Lower Extremity Assessment: Overall WFL for tasks assessed       Communication   Communication: No difficulties  Cognition Arousal/Alertness: Awake/alert Behavior During Therapy: WFL for tasks assessed/performed Overall Cognitive Status: History of cognitive impairments -  at baseline                                               Assessment/Plan    PT Assessment Patient needs continued PT services  PT Problem List Decreased balance       PT Treatment Interventions Gait training;Patient/family education;Balance training    PT Goals (Current goals can be found in the Care Plan section)  Acute Rehab PT Goals Patient Stated Goal: daughter is concern that pt is unsteady on her feet at  home and will not use a walker.  PT Goal Formulation: With family Potential to Achieve Goals: Good    Frequency Min 3X/week   Barriers to discharge                             End of Session Equipment Utilized During Treatment: Gait belt Activity Tolerance: Patient tolerated treatment well Patient left: in bed;with bed alarm set;with call bell/phone within reach;with nursing/sitter in room;with family/visitor present Nurse Communication: Mobility status PT Visit Diagnosis: Unsteadiness on feet (R26.81)    Time: 2409-7353 PT Time Calculation (min) (ACUTE ONLY): 37 min   Charges:   PT Evaluation $PT Eval Low Complexity: [place], PT CLT [phone] 09/11/2017, 3:58 PM

## 2017-09-11 NOTE — ED Triage Notes (Signed)
Pt family reports pt has had unsteady gait,hallucinations, wandering, and recent falls. Pt was diagnosed with UTI yesterday and reports was prescribed azithromycin. Congested cough also noted in triage. Pt alert and assisted to ED stretcher with one person assist. Pt alert and at baseline mentation at this time.

## 2017-09-11 NOTE — H&P (Signed)
TRH H&P   Patient Demographics:    Abigail [name], is a 84 y.o. female  MRN: [id number]   DOB - 09-Jun-1925  Admit Date - 09/11/2017  Outpatient Primary MD for the patient is Glenda Chroman, MD  Referring MD: Dr. Melford Aase  Outpatient Specialists: None  Patient coming from: Home  Chief Complaint  Patient presents with  . Fall      HPI:    Abigail [name]  is a 84 y.o. female, with a history of multiple TIAs/stroke with vascular dementia who lives at home with her daughter was brought to the ED for cough with productive phlegm for past 3 days associated with increased confusion.  Patient had a fall at home 3 days back without loss of consciousness or sustaining any injury.  Since then she has been unsteady and increasingly confused, more during the evening and wandering around the house.  Patient also choked on her food 3 days back.  Daughter denies any fevers, chills, nausea, vomiting, chest pain, shortness of breath, abdominal pain, dysuria or diarrhea.  Denies any sick contact, recent URI symptoms or travel.  At baseline she has moderate to severe dementia (identifies faces and able to carry b few basic conversation), ambulates with some support and holding onto some objects or onto the wall. She was seen by PCP 2 days back where she was diagnosed with UTI and put her on Augmentin. Daughter reports that she has been having difficulty taking care of her at home since he has been increasingly confused and wandering around the house.  In the ED, Vitals were stable.  Initial blood work including CBC and BMET was unremarkable.  UA negative for infection.  Chest x-ray showed early right upper and right lower lobe infiltrate suggestive of pneumonia.  Patient given 500 cc normal saline bolus, IV Rocephin and doxycycline and hospitalist consulted for observation on medical floor.    Review of  systems:    In addition to the HPI above, No Fever-chills, No Headache, No changes with Vision or hearing, Difficulty swallowing Productive cough, no Chest pain,  or Shortness of Breath, No Abdominal pain, No Nausea or vomiting, Bowel movements are regular, No Blood in stool or Urine, No dysuria, No new skin rashes or bruises, No new joints pains-aches,  New confusion, unsteady gait, no new weakness, tingling, numbness in any extremity, No recent weight gain or loss, No polyuria, polydypsia or polyphagia, No significant Mental Stressors.     With Past History of the following :    Past Medical History:  Diagnosis Date  . Arthritis   . Cancer ([place])    breast  . CVA (cerebral infarction)   . Stroke [facility]) Jan. 2015   Right side      Past Surgical History:  Procedure Laterality Date  . CAROTID ENDARTERECTOMY     left  . MASTECTOMY Left   .  spinal disk surgery    . TUBAL LIGATION        Social History:     Social History   Tobacco Use  . Smoking status: Never Smoker  . Smokeless tobacco: Never Used  Substance Use Topics  . Alcohol use: No     Lives -with a daughter at home  Mobility -holds on to object and the walls to ambulate     Family History :     Family History  Problem Relation Age of Onset  . Cancer Father        Colon  . Cancer Sister        Ovarian      Home Medications:   Prior to Admission medications   Medication Sig Start Date End Date Taking? Authorizing Provider  amoxicillin-clavulanate (AUGMENTIN) 500-125 MG tablet Take 1 tablet by mouth 2 (two) times daily with a meal. 09/10/17  Yes [provider]  aspirin 81 MG tablet Take 81 mg by mouth daily.   Yes [provider]  atorvastatin (LIPITOR) 10 MG tablet Take 10 mg by mouth daily at 6 PM.  07/31/15  Yes [provider]  calcium acetate (PHOSLO) 667 MG capsule Take 600 mg by mouth daily.   Yes [provider]  citalopram (CELEXA) 10 MG  tablet Take 10 mg by mouth daily.  07/18/15  Yes [provider]  Cranberry 125 MG TABS Take 1 tablet by mouth at bedtime.   Yes [provider]  donepezil (ARICEPT) 10 MG tablet Take 10 mg by mouth every evening.   Yes [provider]  Multiple Vitamin (MULTIVITAMIN) tablet Take 1 tablet by mouth daily. 50 Plus   Yes [provider]  vitamin E 400 UNIT capsule Take 400 Units by mouth daily.   Yes [provider]     Allergies:     Allergies  Allergen Reactions  . Erythromycin Rash     Physical Exam:   Vitals  Blood pressure (!) 145/72, pulse 83, temperature 98.1 F (36.7 C), temperature source Oral, resp. rate 18, height 5\' 2"  (1.575 m), weight 51.3 kg (113 lb), SpO2 100 %.   General: Elderly female lying in bed not in acute distress HEENT: Pupils reactive bilaterally, EOMI, no pallor, no icterus, moist mucosa, supple neck Chest: Clear to auscultation bilaterally, no added sounds CVS: Normal S1 and S2, no murmurs rubs gallops GI: Soft, nondistended, nontender, bowel sounds present Musculoskeletal: Warm, no edema CNS: Alert and oriented x1 (oriented to person only), nonfocal   Data Review:    CBC Recent Labs  Lab 09/11/17 1054  WBC 4.7  HGB 12.6  HCT 40.1  PLT 215  MCV 95.0  MCH 29.9  MCHC 31.4  RDW 12.5  LYMPHSABS 1.2  MONOABS 1.1*  EOSABS 0.2  BASOSABS 0.0   ------------------------------------------------------------------------------------------------------------------  Chemistries  Recent Labs  Lab 09/11/17 1054  NA 136  K 4.3  CL 100*  CO2 26  GLUCOSE 101*  BUN 18  CREATININE 1.06*  CALCIUM 9.5  AST 23  ALT 14  ALKPHOS 60  BILITOT 0.6   ------------------------------------------------------------------------------------------------------------------ estimated creatinine clearance is 27.9 mL/min (A) (by C-G formula based on SCr of 1.06 mg/dL  (H)). ------------------------------------------------------------------------------------------------------------------ No results for input(s): TSH, T4TOTAL, T3FREE, THYROIDAB in the last 72 hours.  Invalid input(s): FREET3  Coagulation profile No results for input(s): INR, PROTIME in the last 168 hours. ------------------------------------------------------------------------------------------------------------------- No results for input(s): DDIMER in the last 72 hours. -------------------------------------------------------------------------------------------------------------------  Cardiac Enzymes Recent  Labs  Lab 09/11/17 1054  TROPONINI <0.03   ------------------------------------------------------------------------------------------------------------------ No results found for: BNP   ---------------------------------------------------------------------------------------------------------------  Urinalysis    Component Value Date/Time   COLORURINE YELLOW 09/11/2017 [place] 09/11/2017 1029   LABSPEC 1.023 09/11/2017 1029   PHURINE 5.0 09/11/2017 1029   GLUCOSEU NEGATIVE 09/11/2017 1029   HGBUR NEGATIVE 09/11/2017 1029   BILIRUBINUR NEGATIVE 09/11/2017 1029   KETONESUR 5 (A) 09/11/2017 1029   PROTEINUR NEGATIVE 09/11/2017 1029   UROBILINOGEN 0.2 03/10/2008 0720   NITRITE NEGATIVE 09/11/2017 [place] 09/11/2017 1029    ----------------------------------------------------------------------------------------------------------------   Imaging Results:    Dg Chest 2 View  Result Date: 09/11/2017 CLINICAL DATA:  Cough EXAM: CHEST  2 VIEW COMPARISON:  04/29/2017 FINDINGS: Mild patchy right upper lobe and right lower lobe opacities, pneumonia not excluded. Left lung is essentially clear. No pleural effusion or pneumothorax. The heart is normal in size. Status post left mastectomy with left axillary lymph node dissection. IMPRESSION:  Mild patchy right upper and lower lobe opacities, pneumonia not excluded. Electronically Signed   By: Julian Hy M.D.   On: 09/11/2017 11:30   Ct Head Wo Contrast  Result Date: 09/11/2017 CLINICAL DATA:  Unsteady gait, hallucinations and recent falls. The patient was diagnosed with a urinary tract infection yesterday. EXAM: CT HEAD WITHOUT CONTRAST TECHNIQUE: Contiguous axial images were obtained from the base of the skull through the vertex without intravenous contrast. COMPARISON:  Brain MRI 05/15/2013.  Head CT scan 05/14/2013. FINDINGS: Brain: There is atrophy and extensive chronic microvascular ischemic change. No evidence of acute abnormality including hemorrhage, infarct, mass lesion, mass effect, midline shift or abnormal extra-axial fluid collection. No hydrocephalus or pneumocephalus. Vascular: Extensive atherosclerosis is seen. Skull: Intact. Sinuses/Orbits: Negative. Other: None. IMPRESSION: No acute abnormality. Atrophy and extensive chronic microvascular ischemic change. Electronically Signed   By: Inge Rise M.D.   On: 09/11/2017 11:17    My personal review of EKG: None   Assessment & Plan:    Principal Problem:   CAP (community acquired pneumonia)   Lobar pneumonia ([place]) Right upper and lobar pneumonia on chest x-ray.  Placed on observation.  Empiric Rocephin and doxycycline.  Supportive care with Mucinex and as needed Robitussin.  Tylenol as needed for fever.  Gentle IV hydration.  Follow strep urine antigen and sputum culture. Given history of choking and daughter reporting patient having difficulty intermittently with swallowing I will obtain a swallow evaluation to rule out aspiration risk.  Active Problems:   Vascular dementia Moderate to severe dementia.  Continue Aricept and Celexa.     Acute delirium Secondary to underlying pneumonia worsening her dementia.  Will order as needed Seroquel as needed for  agitation.     Fall at home, initial  encounter Daughter reports unsteady gait for past 3 days. No injury sustained.  Head CT unremarkable.  PT evaluation.    DVT Prophylaxis: Subcu Lovenox  AM Labs Ordered, also please review Full Orders  Family Communication: Admission, patients condition and plan of care including tests being ordered have been discussed with the patient and daughter at bedside  Code Status:  Partial (DO NOT INTUBATE; daughter reports that when patient was oriented and able to make decision she wished not to be intubated but was okay with CPR and ACLS medication.)  Likely DC to home tomorrow if improved  Condition fair  Consults called: None  Admission status: Observation  Time spent in minutes : 50   Kain Milosevic  M.D on 09/11/2017 at 1:02 PM  Between 7am to 7pm - Pager - [phone]. After 7pm go to [email or web address] - password [facility]  [facility] - Office  [phone]

## 2017-09-11 NOTE — ED Provider Notes (Signed)
[facility] EMERGENCY DEPARTMENT Provider Note   CSN: [id number] Arrival date & time: 09/11/17  1001     History   Chief Complaint Chief Complaint  Patient presents with  . Fall    HPI Abigail [name] is a 84 y.o. female.  The history is provided by the patient and a relative. The history is limited by the condition of the patient.  Fall  This is a new problem. The current episode started 2 days ago. The problem occurs constantly. The problem has not changed since onset.Pertinent negatives include no chest pain, no abdominal pain, no headaches and no shortness of breath. Nothing aggravates the symptoms. Nothing relieves the symptoms. The treatment provided no relief.  Altered Mental Status   This is a new problem. The current episode started 2 days ago. The problem has been gradually worsening. Associated symptoms include confusion, weakness, agitation and hallucinations.    Level 5 caveat secondary to dementia.  84 year old female brought in by her daughter for complaints of increased confusion and generalized weakness.  Saturday she had a minor fall bathtub.  She had some abrasions to her right lower leg from that.  Starting on Sunday the daughter noticed some increased confusion.  Monday she took her to the PCP where they diagnosed her with a UTI and put her on amoxicillin.  Yesterday she started with a cough that is nonproductive.  Starting yesterday the patient's been increasingly confused and now is having auditory hallucinations hearing somebody singing, visual hallucinations of a pink cat walking across the room.  The daughter states she is afraid to leave her alone now because she is more confused and wandering around the house. The patient self denies any complaints.  Past Medical History:  Diagnosis Date  . Arthritis   . Cancer ([place])    breast  . CVA (cerebral infarction)   . Stroke [facility]) Jan. 2015   Right side    Patient Active Problem List   Diagnosis Date Noted  .  Aftercare following surgery of the circulatory system 06/15/2014  . Carotid stenosis 06/15/2014  . Occlusion and stenosis of carotid artery without mention of cerebral infarction 06/09/2013    Past Surgical History:  Procedure Laterality Date  . CAROTID ENDARTERECTOMY     left  . MASTECTOMY Left   . spinal disk surgery    . TUBAL LIGATION      OB History    No data available       Home Medications    Prior to Admission medications   Medication Sig Start Date End Date Taking? Authorizing Provider  amoxicillin-clavulanate (AUGMENTIN) 500-125 MG tablet Take 1 tablet by mouth 2 (two) times daily with a meal. 09/10/17   [provider]  aspirin 81 MG tablet Take 81 mg by mouth daily.    [provider]  atorvastatin (LIPITOR) 10 MG tablet  07/31/15   [provider]  calcium acetate (PHOSLO) 667 MG capsule Take 600 mg by mouth daily.    [provider]  citalopram (CELEXA) 10 MG tablet  07/18/15   [provider]  donepezil (ARICEPT) 10 MG tablet Take 10 mg by mouth every evening.    [provider]  Fexofenadine HCl (ALLEGRA PO) Take by mouth daily.    [provider]  Multiple Vitamin (MULTIVITAMIN) tablet Take 1 tablet by mouth daily. 50 Plus    [provider]    Family History Family History  Problem Relation Age of Onset  . Cancer  Father        Colon  . Cancer Sister        Ovarian    Social History Social History   Tobacco Use  . Smoking status: Never Smoker  . Smokeless tobacco: Never Used  Substance Use Topics  . Alcohol use: No  . Drug use: No     Allergies   Erythromycin   Review of Systems Review of Systems  Unable to perform ROS: Dementia  Respiratory: Negative for shortness of breath.   Cardiovascular: Negative for chest pain.  Gastrointestinal: Negative for abdominal pain.  Neurological: Positive for weakness. Negative for headaches.  Psychiatric/Behavioral: Positive for  agitation, confusion and hallucinations.     Physical Exam Updated Vital Signs BP 119/81 (BP Location: Right Arm)   Pulse 93   Temp 98.1 F (36.7 C) (Oral)   Resp 18   Ht 5\' 2"  (1.575 m)   Wt 51.3 kg (113 lb)   SpO2 97%   BMI 20.67 kg/m   Physical Exam  Constitutional: She appears well-developed and well-nourished. No distress.  HENT:  Head: Normocephalic and atraumatic.  Eyes: Conjunctivae are normal.  Neck: Neck supple.  Cardiovascular: Normal rate. A regularly irregular rhythm present.  No murmur heard. Pulmonary/Chest: Effort normal and breath sounds normal. No respiratory distress.  Abdominal: Soft. There is no tenderness.  Musculoskeletal: She exhibits no edema.  Neurological: She is alert. She has normal strength. She is disoriented. No cranial nerve deficit or sensory deficit. GCS eye subscore is 4. GCS verbal subscore is 5. GCS motor subscore is 6.  Skin: Skin is warm and dry.     Psychiatric: She has a normal mood and affect.  Nursing note and vitals reviewed.    ED Treatments / Results  Labs (all labs ordered are listed, but only abnormal results are displayed) Labs Reviewed  CBC WITH DIFFERENTIAL/PLATELET - Abnormal; Notable for the following components:      Result Value   Monocytes Absolute 1.1 (*)    All other components within normal limits  COMPREHENSIVE METABOLIC PANEL - Abnormal; Notable for the following components:   Chloride 100 (*)    Glucose, Bld 101 (*)    Creatinine, Ser 1.06 (*)    GFR calc non Af Amer 45 (*)    GFR calc Af Amer 52 (*)    All other components within normal limits  URINALYSIS, ROUTINE W REFLEX MICROSCOPIC - Abnormal; Notable for the following components:   Ketones, ur 5 (*)    All other components within normal limits  CULTURE, EXPECTORATED SPUTUM-ASSESSMENT  TROPONIN I  LIPASE, BLOOD  STREP PNEUMONIAE URINARY ANTIGEN    EKG  EKG Interpretation  Date/Time:  Wednesday September 11 2017 10:13:42 EST Ventricular  Rate:  89 PR Interval:    QRS Duration: 87 QT Interval:  388 QTC Calculation: 473 R Axis:   1 Text Interpretation:  Sinus rhythm Anterior infarct, old similar to prior 4/10 Confirmed by Aletta Edouard [phone]) on 09/11/2017 10:35:20 AM       Radiology Dg Chest 2 View  Result Date: 09/11/2017 CLINICAL DATA:  Cough EXAM: CHEST  2 VIEW COMPARISON:  04/29/2017 FINDINGS: Mild patchy right upper lobe and right lower lobe opacities, pneumonia not excluded. Left lung is essentially clear. No pleural effusion or pneumothorax. The heart is normal in size. Status post left mastectomy with left axillary lymph node dissection. IMPRESSION: Mild patchy right upper and lower lobe opacities, pneumonia not excluded. Electronically Signed   By: Julian Hy  M.D.   On: 09/11/2017 11:30   Ct Head Wo Contrast  Result Date: 09/11/2017 CLINICAL DATA:  Unsteady gait, hallucinations and recent falls. The patient was diagnosed with a urinary tract infection yesterday. EXAM: CT HEAD WITHOUT CONTRAST TECHNIQUE: Contiguous axial images were obtained from the base of the skull through the vertex without intravenous contrast. COMPARISON:  Brain MRI 05/15/2013.  Head CT scan 05/14/2013. FINDINGS: Brain: There is atrophy and extensive chronic microvascular ischemic change. No evidence of acute abnormality including hemorrhage, infarct, mass lesion, mass effect, midline shift or abnormal extra-axial fluid collection. No hydrocephalus or pneumocephalus. Vascular: Extensive atherosclerosis is seen. Skull: Intact. Sinuses/Orbits: Negative. Other: None. IMPRESSION: No acute abnormality. Atrophy and extensive chronic microvascular ischemic change. Electronically Signed   By: Inge Rise M.D.   On: 09/11/2017 11:17    Procedures Procedures (including critical care time)  Medications Ordered in ED Medications  enoxaparin (LOVENOX) injection 40 mg (40 mg Subcutaneous Given 09/11/17 1627)  sodium chloride flush (NS) 0.9 %  injection 3 mL (3 mLs Intravenous Not Given 09/11/17 1628)  0.9 %  sodium chloride infusion ( Intravenous New Bag/Given 09/11/17 1627)  acetaminophen (TYLENOL) tablet 650 mg (not administered)    Or  acetaminophen (TYLENOL) suppository 650 mg (not administered)  ondansetron (ZOFRAN) tablet 4 mg (not administered)    Or  ondansetron (ZOFRAN) injection 4 mg (not administered)  guaiFENesin (MUCINEX) 12 hr tablet 600 mg (600 mg Oral Given 09/11/17 1626)  cefTRIAXone (ROCEPHIN) 1 g in dextrose 5 % 50 mL IVPB (not administered)  doxycycline (VIBRA-TABS) tablet 100 mg (not administered)  aspirin chewable tablet 81 mg (81 mg Oral Given 09/11/17 1626)  atorvastatin (LIPITOR) tablet 10 mg (10 mg Oral Given 09/11/17 1624)  calcium acetate (PHOSLO) capsule 667 mg (not administered)  citalopram (CELEXA) tablet 10 mg (10 mg Oral Given 09/11/17 1627)  donepezil (ARICEPT) tablet 10 mg (10 mg Oral Given 09/11/17 1625)  multivitamin with minerals tablet 1 tablet (not administered)  vitamin E capsule 400 Units (400 Units Oral Given 09/11/17 1625)  QUEtiapine (SEROQUEL) tablet 25 mg (not administered)  sodium chloride 0.9 % bolus 500 mL (0 mLs Intravenous Stopped 09/11/17 1222)  cefTRIAXone (ROCEPHIN) 1 g in dextrose 5 % 50 mL IVPB (0 g Intravenous Stopped 09/11/17 1303)  doxycycline (VIBRA-TABS) tablet 100 mg (100 mg Oral Given 09/11/17 1223)     Initial Impression / Assessment and Plan / ED Course  I have reviewed the triage vital signs and the nursing notes.  Pertinent labs & imaging results that were available during my care of the patient were reviewed by me and considered in my medical decision making (see chart for details).  Clinical Course as of Sep 11 1817  Wed Sep 11, 2017  1202 Reviewed results with the patient and the daughter.  It was significant finding was a chest x-ray that was equivocal for pneumonia.  Daughter does not feel comfortable with the patient returning home states she has not got any  sleep in 2 days with all of the patient's agitation.  I paged the hospitalist and will discuss the admission for her.   [MB]  1230 Discussed with hospitalist on-call Dr Renelda Mom who accepts patient to his service.  [MB]    Clinical Course User Index [MB] Hayden Rasmussen, MD      Final Clinical Impressions(s) / ED Diagnoses   Final diagnoses:  Pneumonia due to infectious organism, unspecified laterality, unspecified part of lung  Acute confusional state    ED  Discharge Orders    None       Hayden Rasmussen, MD 09/11/17 [place] Emerald

## 2017-09-12 DIAGNOSIS — R410 Disorientation, unspecified: Secondary | ICD-10-CM | POA: Diagnosis not present

## 2017-09-12 DIAGNOSIS — F0151 Vascular dementia with behavioral disturbance: Secondary | ICD-10-CM | POA: Diagnosis not present

## 2017-09-12 DIAGNOSIS — J181 Lobar pneumonia, unspecified organism: Secondary | ICD-10-CM | POA: Diagnosis not present

## 2017-09-12 LAB — STREP PNEUMONIAE URINARY ANTIGEN: STREP PNEUMO URINARY ANTIGEN: NEGATIVE

## 2017-09-12 MED ORDER — ENOXAPARIN SODIUM 30 MG/0.3ML ~~LOC~~ SOLN: 30.0000 mg | SUBCUTANEOUS | Status: DC

## 2017-09-12 MED ORDER — LEVOFLOXACIN 500 MG PO TABS: 500.0000 mg | ORAL_TABLET | Freq: Every day | ORAL | 0 refills | Status: AC

## 2017-09-12 NOTE — Evaluation (Signed)
Clinical/Bedside [name] Evaluation Patient Details  Name: Abigail [name] MRN: [id number] Date of Birth: 1925/08/04  Today's Date: 09/12/2017 Time: SLP Start Time (ACUTE ONLY): 8182 SLP Stop Time (ACUTE ONLY): 1245 SLP Time Calculation (min) (ACUTE ONLY): 23 min  Past Medical History:  Past Medical History:  Diagnosis Date  . Arthritis   . Cancer ([place])    breast  . CVA (cerebral infarction)   . Stroke [facility]) Jan. 2015   Right side   Past Surgical History:  Past Surgical History:  Procedure Laterality Date  . CAROTID ENDARTERECTOMY     left  . MASTECTOMY Left   . spinal disk surgery    . TUBAL LIGATION     HPI:  Abigail [name]  is a 84 y.o. female, with a history of multiple TIAs/stroke with vascular dementia who lives at home with her daughter was brought to the ED for cough with productive phlegm for past 3 days associated with increased confusion.  Patient had a fall at home 3 days back without loss of consciousness or sustaining any injury.  Since then she has been unsteady and increasingly confused, more during the evening and wandering around the house.  Patient also choked on her food 3 days back.  Daughter denies any fevers, chills, nausea, vomiting, chest pain, shortness of breath, abdominal pain, dysuria or diarrhea.  Denies any sick contact, recent URI symptoms or travel.  At baseline she has moderate to severe dementia (identifies faces and able to carry b few basic conversation), ambulates with some support and holding onto some objects or onto the wall.   Assessment / Plan / Recommendation Clinical Impression  Clinical [name] evaluation completed at bedside. Oral motor examination is WFL. Pt without overt signs or symptoms of aspiration clinically when Pt self feeding. Pt's daughter reports that Pt often complains of early satiety and "foods not going down". She also describes an incident on Monday evening when Pt appeared to have difficulty swallowing chicken. SLP suggested  modifying textures (chop meats, soft and moist). Her daughter also reports increased difficulty with dinner meal and does better with breakfast and lunch. Aspiration and reflux precautions were reviewed and daughter appreciative of information. This is Pt's first recent occurrence of possible PNA and Pt was exposed to family member with recent URI. Recommend D3/mech soft and thin liquids and no further SLP follow up indicated at this time. Can consider OP GI consult if belching and early satiety sx persist.  SLP Visit Diagnosis: Dysphagia, unspecified (R13.10)    Aspiration Risk  No limitations;Mild aspiration risk    Diet Recommendation D3/mech soft;Thin liquid   Liquid Administration via: Cup;Straw Medication Administration: Whole meds with liquid Supervision: Patient able to self feed Compensations: Small sips/bites Postural Changes: Seated upright at 90 degrees;Remain upright for at least 30 minutes after po intake    Other  Recommendations Oral Care Recommendations: Oral care BID;Staff/trained caregiver to provide oral care Other Recommendations: Clarify dietary restrictions   Follow up Recommendations None      Frequency and Duration            Prognosis Prognosis for Safe Diet Advancement: Good Barriers to Reach Goals: Cognitive deficits      [name] Study   General Date of Onset: 09/11/17 HPI: Abigail [name]  is a 84 y.o. female, with a history of multiple TIAs/stroke with vascular dementia who lives at home with her daughter was brought to the ED for cough with productive phlegm for past 3 days associated with increased  confusion.  Patient had a fall at home 3 days back without loss of consciousness or sustaining any injury.  Since then she has been unsteady and increasingly confused, more during the evening and wandering around the house.  Patient also choked on her food 3 days back.  Daughter denies any fevers, chills, nausea, vomiting, chest pain, shortness of breath,  abdominal pain, dysuria or diarrhea.  Denies any sick contact, recent URI symptoms or travel.  At baseline she has moderate to severe dementia (identifies faces and able to carry b few basic conversation), ambulates with some support and holding onto some objects or onto the wall. Type of Study: Bedside [name] Evaluation Previous [name] Assessment: None on record Diet Prior to this Study: (clear liquids) Temperature Spikes Noted: No Respiratory Status: Room air History of Recent Intubation: No Behavior/Cognition: Alert;Cooperative;Pleasant mood Oral Cavity Assessment: Within Functional Limits Oral Care Completed by SLP: No Oral Cavity - Dentition: Adequate natural dentition Vision: Functional for self-feeding Self-Feeding Abilities: Able to feed self Patient Positioning: Upright in bed Baseline Vocal Quality: Normal Volitional Cough: Strong;Congested Volitional [name]: Able to elicit    Oral/Motor/Sensory Function Overall Oral Motor/Sensory Function: Within functional limits   Ice Chips Ice chips: Within functional limits Presentation: Spoon   Thin Liquid Thin Liquid: Within functional limits Presentation: Cup;Self Fed;Straw    Nectar Thick Nectar Thick Liquid: Not tested   Honey Thick Honey Thick Liquid: Not tested   Puree Puree: Within functional limits Presentation: Spoon   Solid   Thank you,  Genene Churn, CCC-SLP [phone]    Solid: Within functional limits Presentation: Self Fed        PORTER,DABNEY 09/12/2017,12:46 PM

## 2017-09-12 NOTE — Care Management Obs Status (Signed)
Reform NOTIFICATION   Patient Details  Name: Abigail [name] MRN: [id number] Date of Birth: Jul 23, 1925   Medicare Observation Status Notification Given:  Yes(verbal signature from daughter-Lisa Morada)    Micharl Helmes, Chauncey Reading, RN 09/12/2017, 10:51 AM

## 2017-09-12 NOTE — Discharge Summary (Signed)
Physician Discharge Summary  Abigail [name] XHB:[id number] DOB: Apr 01, 1925 DOA: 09/11/2017  PCP: Glenda Chroman, MD  Admit date: 09/11/2017 Discharge date: 09/12/2017  Admitted From: Home Disposition: Home  Recommendations for Outpatient Follow-up:  1. Follow up with PCP in 1-2 weeks 2. Patient will complete 7-day course of antibiotic on 09/18/2017  Home Health: PT Equipment/Devices: None  Discharge Condition: Fair CODE STATUS: Partial (DO NOT INTUBATE; daughter reports that when patient was oriented and able to make decision she wished not to be intubated but was okay with CPR and ACLS medication.) Diet recommendation: Dysphagia level 3    Discharge Diagnoses:  Principal Problem:   Lobar pneumonia ([place])  Active Problems:   CAP (community acquired pneumonia)   Vascular dementia   Acute delirium   Fall at home, initial encounter  Brief narrative/HPI Please refer to admission H&P for details, in brief,84 y.o. female, with a history of multiple TIAs/stroke with vascular dementia who lives at home with her daughter was brought to the ED for cough with productive phlegm for past 3 days associated with increased confusion.  Patient had a fall at home 3 days back without loss of consciousness or sustaining any injury.  Since then she has been unsteady and increasingly confused, more during the evening and wandering around the house.  Patient also choked on her food 3 days back.  Daughter denies any fevers, chills, nausea, vomiting, chest pain, shortness of breath, abdominal pain, dysuria or diarrhea.  Denies any sick contact, recent URI symptoms or travel.  At baseline she has moderate to severe dementia (identifies faces and able to carry b few basic conversation), ambulates with some support and holding onto some objects or onto the wall. She was seen by PCP 2 days back where she was diagnosed with UTI and put her on Augmentin . Daughter reports that she has been having difficulty taking care  of her at home since he has been increasingly confused and wandering around the house.  In the ED, Vitals were stable.  Initial blood work including CBC and BMET was unremarkable.  UA negative for infection.  Chest x-ray showed early right upper and right lower lobe infiltrate suggestive of pneumonia.  Patient given 500 cc normal saline bolus, IV Rocephin and doxycycline and hospitalist consulted for observation on medical floor  Hospital course   Principal Problem:    CAP (community acquired pneumonia)   Lobar pneumonia ([place]) Right upper and lobar pneumonia on chest x-ray.  Placed on observation.  Empiric Rocephin and doxycycline.  Supportive care with Mucinex and as needed Robitussin.  Tylenol as needed for fever.  Even gentle hydration.  Urine strep antigen negative. Patient clinically improved and remains afebrile.  Mental status appears to be improved to baseline.  I will discharge her on oral Levaquin to complete 7-day course of antibiotic.  Follow-up with PCP in 1 week  Given history of choking and daughter reporting patient having difficulty intermittently with swallowing swallow evaluation was obtained  Active Problems:   Vascular dementia Moderate to severe dementia.  Continue Aricept and Celexa.     Acute delirium Secondary to underlying pneumonia worsening her dementia.    Improved to baseline.      Fall at home, initial encounter Daughter reports unsteady gait for past 3 days. No injury sustained.  Head CT unremarkable.    Evaluated by PT and recommends home health with 24-hour supervision.  Dysphagia, unspecified with mild aspiration risk Seen by SLP who recommended dysphagia level 3 mechanical  soft diet with thin liquid Recommendations given to patient's daughter.     Family Communication:  Discussed with daughter  Procedure: Head CT Disposition: Home        Discharge Instructions   Allergies as of 09/12/2017      Reactions   Erythromycin  Rash      Medication List    STOP taking these medications   amoxicillin-clavulanate 500-125 MG tablet Commonly known as:  AUGMENTIN     TAKE these medications   aspirin 81 MG tablet Take 81 mg by mouth daily.   atorvastatin 10 MG tablet Commonly known as:  LIPITOR Take 10 mg by mouth daily at 6 PM.   calcium acetate 667 MG capsule Commonly known as:  PHOSLO Take 600 mg by mouth daily.   citalopram 10 MG tablet Commonly known as:  CELEXA Take 10 mg by mouth daily.   Cranberry 125 MG Tabs Take 1 tablet by mouth at bedtime.   donepezil 10 MG tablet Commonly known as:  ARICEPT Take 10 mg by mouth every evening.   levofloxacin 500 MG tablet Commonly known as:  LEVAQUIN Take 1 tablet (500 mg total) by mouth daily for 5 days.   multivitamin tablet Take 1 tablet by mouth daily. 50 Plus   vitamin E 400 UNIT capsule Take 400 Units by mouth daily.      Follow-up Information    [facility] Follow up.   Specialty:  Home Health Services Contact information: [address] [place]  [address] [phone]        Glenda Chroman, MD Follow up in 1 week(s).   Specialty:  Internal Medicine Contact information: [place] [address] [phone]          Allergies  Allergen Reactions  . Erythromycin Rash        Procedures/Studies: Dg Chest 2 View  Result Date: 09/11/2017 CLINICAL DATA:  Cough EXAM: CHEST  2 VIEW COMPARISON:  04/29/2017 FINDINGS: Mild patchy right upper lobe and right lower lobe opacities, pneumonia not excluded. Left lung is essentially clear. No pleural effusion or pneumothorax. The heart is normal in size. Status post left mastectomy with left axillary lymph node dissection. IMPRESSION: Mild patchy right upper and lower lobe opacities, pneumonia not excluded. Electronically Signed   By: Julian Hy M.D.   On: 09/11/2017 11:30   Ct Head Wo Contrast  Result Date: 09/11/2017 CLINICAL DATA:  Unsteady gait,  hallucinations and recent falls. The patient was diagnosed with a urinary tract infection yesterday. EXAM: CT HEAD WITHOUT CONTRAST TECHNIQUE: Contiguous axial images were obtained from the base of the skull through the vertex without intravenous contrast. COMPARISON:  Brain MRI 05/15/2013.  Head CT scan 05/14/2013. FINDINGS: Brain: There is atrophy and extensive chronic microvascular ischemic change. No evidence of acute abnormality including hemorrhage, infarct, mass lesion, mass effect, midline shift or abnormal extra-axial fluid collection. No hydrocephalus or pneumocephalus. Vascular: Extensive atherosclerosis is seen. Skull: Intact. Sinuses/Orbits: Negative. Other: None. IMPRESSION: No acute abnormality. Atrophy and extensive chronic microvascular ischemic change. Electronically Signed   By: Inge Rise M.D.   On: 09/11/2017 11:17       Subjective: No overnight events.  Afebrile.  Discharge Exam: Vitals:   09/11/17 2141 09/12/17 0530  BP: (!) 143/67 108/84  Pulse: 85 80  Resp: 20   Temp: 98.6 F (37 C) 98 F (36.7 C)  SpO2: 97% 100%   Vitals:   09/11/17 1200 09/11/17 1432 09/11/17 2141 09/12/17 0530  BP: (!) 145/72 (!) 148/98 (!) 143/67 108/84  Pulse: 83 96 85 80  Resp: 18 18 20    Temp:  98.2 F (36.8 C) 98.6 F (37 C) 98 F (36.7 C)  TempSrc:  Oral Oral Oral  SpO2: 100% 100% 97% 100%  Weight:  50.2 kg (110 lb 10.7 oz)    Height:  5\' 2"  (1.575 m)      General: Elderly female, confused, not in distress HEENT: No pallor, moist mucosa, supple neck Chest: Clear bilaterally CVS: Normal S1 and S2, no murmurs GI: Soft, nondistended, nontender Musculoskeletal: Warm, no edema    The results of significant diagnostics from this hospitalization (including imaging, microbiology, ancillary and laboratory) are listed below for reference.     Microbiology: No results found for this or any previous visit (from the past 240 hour(s)).   Labs: BNP (last 3 results) No  results for input(s): BNP in the last 8760 hours. Basic Metabolic Panel: Recent Labs  Lab 09/11/17 1054  NA 136  K 4.3  CL 100*  CO2 26  GLUCOSE 101*  BUN 18  CREATININE 1.06*  CALCIUM 9.5   Liver Function Tests: Recent Labs  Lab 09/11/17 1054  AST 23  ALT 14  ALKPHOS 60  BILITOT 0.6  PROT 7.7  ALBUMIN 4.0   Recent Labs  Lab 09/11/17 1054  LIPASE 43   No results for input(s): AMMONIA in the last 168 hours. CBC: Recent Labs  Lab 09/11/17 1054  WBC 4.7  NEUTROABS 2.3  HGB 12.6  HCT 40.1  MCV 95.0  PLT 215   Cardiac Enzymes: Recent Labs  Lab 09/11/17 1054  TROPONINI <0.03   BNP: Invalid input(s): POCBNP CBG: No results for input(s): GLUCAP in the last 168 hours. D-Dimer No results for input(s): DDIMER in the last 72 hours. Hgb A1c No results for input(s): HGBA1C in the last 72 hours. Lipid Profile No results for input(s): CHOL, HDL, LDLCALC, TRIG, CHOLHDL, LDLDIRECT in the last 72 hours. Thyroid function studies No results for input(s): TSH, T4TOTAL, T3FREE, THYROIDAB in the last 72 hours.  Invalid input(s): FREET3 Anemia work up No results for input(s): VITAMINB12, FOLATE, FERRITIN, TIBC, IRON, RETICCTPCT in the last 72 hours. Urinalysis    Component Value Date/Time   COLORURINE YELLOW 09/11/2017 1029   APPEARANCEUR CLEAR 09/11/2017 1029   LABSPEC 1.023 09/11/2017 1029   PHURINE 5.0 09/11/2017 1029   GLUCOSEU NEGATIVE 09/11/2017 1029   HGBUR NEGATIVE 09/11/2017 1029   BILIRUBINUR NEGATIVE 09/11/2017 1029   KETONESUR 5 (A) 09/11/2017 1029   PROTEINUR NEGATIVE 09/11/2017 1029   UROBILINOGEN 0.2 03/10/2008 0720   NITRITE NEGATIVE 09/11/2017 1029   LEUKOCYTESUR NEGATIVE 09/11/2017 1029   Sepsis Labs Invalid input(s): PROCALCITONIN,  WBC,  LACTICIDVEN Microbiology No results found for this or any previous visit (from the past 240 hour(s)).   Time coordinating discharge: <30 minutes  SIGNED:   Louellen Molder, MD  [facility] 09/12/2017, 2:59 PM Pager   If 7PM-7AM, please contact night-coverage [email or web address] Password [email or web address]

## 2017-09-12 NOTE — Discharge Instructions (Addendum)
Liquid Administration via: Cup;Straw Medication Administration: Whole meds with liquid Supervision: Patient able to self feed Compensations: Small sips/bites Postural Changes: Seated upright at 90 degrees;Remain upright for at least 30 minutes after po intake    Dysphagia Diet Level 3, Mechanically Advanced The dysphagia level 3 diet includes foods that are soft, moist, and can be chopped into 1-inch chunks. This diet is helpful for people with mild swallowing difficulties. It reduces the risk of food getting caught in the windpipe, trachea, or lungs. What do I need to know about this diet?  You may eat foods that are soft and moist.  If you were on the dysphagia level 1 or level 2 diets, you may eat any of the foods included on those lists.  Avoid foods that are dry, hard, sticky, chewy, coarse, and crunchy. Also avoid large cuts of food.  Take small bites. Each bite should contain 1 inch or less of food.  Thicken liquids if instructed by your health care provider. Follow your health care provider's instructions on how to do this and to what consistency.  See your dietitian or speech language pathologist regularly for help with your dietary changes. What foods can I eat? Grains Moist breads without nuts or seeds. Biscuits, muffins, pancakes, and waffles well-moistened with syrup, jelly, margarine, or butter. Smooth cereals with plenty of milk to moisten them. Moist bread stuffing. Moist rice. Vegetables All cooked, soft vegetables. Shredded lettuce. Tender fried potatoes. Fruits All canned and cooked fruits. Soft, peeled fresh fruits, such as peaches, nectarines, kiwis, cantaloupe, honeydew melon, and watermelon without seeds. Soft berries, such as strawberries. Meat and Other Protein Sources Moist ground or finely diced or sliced meats. Solid, tender cuts of meat. Meatloaf. Hamburger with a bun. Sausage patty. Deli thin-sliced lunch meat. Chicken, egg, or tuna salad sandwich.  Sloppy joe. Moist fish. Eggs prepared any way. Casseroles with small chunks of meats, ground meats, or tender meats. Dairy Cheese spreads without coarse large chunks. Shredded cheese. Cheese slices. Cottage cheese. Milk at the right texture. Smooth frappes. Yogurt without nuts or coconut. Ask your health care provider whether you can have frozen desserts (such as malts or milk shakes) and thin liquids. Sweets/Desserts Soft, smooth, moist desserts. Non-chewy, smooth candy. Jam. Jelly. Honey. Preserves. Ask your health care provider whether you can have frozen desserts. Fats and Oils Butter. Oils. Margarine. Mayonnaise. Gravy. Spreads. Other All seasonings and sweeteners. All sauces without large chunks. The items listed above may not be a complete list of recommended foods or beverages. Contact your dietitian for more options. What foods are not recommended? Grains Coarse or dry cereals. Dry breads. Toast. Crackers. Tough, crusty breads, such French bread and baguettes. Tough, crisp fried potatoes. Potato skins. Dry bread stuffing. Granola. Popcorn. Chips. Vegetables All raw vegetables except shredded lettuce. Cooked corn. Rubbery or stiff cooked vegetables. Stringy vegetables, such as celery. Fruits Hard fruits that are difficult to chew, such as apples or pears. Stringy, high-pulp fruits, such as pineapple, papaya, or mango. Fruits with tough skins, such as grapes. Coconut. All dried fruits. Fruit leather. Fruit roll-ups. Fruit snacks. Meat and Other Protein Sources Dry or tough meats or poultry. Dry fish. Fish with bones. Peanut butter. All nuts and seeds. Dairy Any with nuts, seeds, chocolate chips, dried fruit, coconut, or pineapple. Sweets/Desserts Dry cakes. Chewy or dry cookies. Any with nuts, seeds, dry fruits, coconut, pineapple, or anything dry, sticky, or hard. Chewy caramel. Licorice. Taffy-type candies. Ask your health care provider whether you can have  frozen desserts. Fats and  Oils Any with chunks, nuts, seeds, or pineapple. Olives. Angie Fava. Other Soups with tough or large chunks of meats, poultry, or vegetables. Corn or clam chowder. The items listed above may not be a complete list of foods and beverages to avoid. Contact your dietitian for more information. This information is not intended to replace advice given to you by your health care provider. Make sure you discuss any questions you have with your health care provider. Document Released: 07/30/2005 Document Revised: 01/05/2016 Document Reviewed: 07/13/2013 Elsevier Interactive Patient Education  [employer].

## 2017-09-12 NOTE — Care Management Note (Addendum)
Case Management Note  Patient Details  Name: Abigail [name] MRN: [id number] Date of Birth: July 27, 1925  Subjective/Objective:                  Adm with CAP/falls. From home, daughter and SIL live with her. She has dementia, SIL is with patient most of the day. Daughter looking into sitters for during the day. She has RW, cane, shower chair, safety bars, and high seated toilet. Patient has been recommended for [facility] PT. Daughter is agreeable, she would like [facility].   Action/Plan: Katrina of [facility] notified and will obtain orders from [place]. Anticipate DC today after swallow evaluation.    ADDENDUM: [facility] can not accept referral due to insurance. Daughter would now like [facility]. Juliann Pulse of [facility] notified and will obtain orders from [place].   Expected Discharge Date:      09/12/2017            Expected Discharge Plan:  [place]  In-House Referral:     Discharge planning Services  CM Consult  Post Acute Care Choice:  Home Health Choice offered to:  Adult Children  DME Arranged:    DME Agency:     HH Arranged:   PT HH Agency: [facility]  Status of Service:  Completed, signed off  If discussed at [place] of Stay Meetings, dates discussed:    Additional Comments:  Prudence Heiny, Chauncey Reading, RN 09/12/2017, 10:53 AM

## 2017-09-12 NOTE — Progress Notes (Signed)
Patient discharged home with daughter.  IV removed - WNL.  Reviewed AVS and medications with daughter, instructed on importance of completing entire dose of abx and follow up with PCP.  Verbalizes understanding.  No questions at this time.  Assisted off unit via WC in NAD

## 2017-09-17 NOTE — Care Management (Signed)
09/17/2017 1020 Received call from patient's daughter -Langley Gauss- asking about home health. They have not received call/visit from [facility] yet, patient DC'd 09/12/2017.  Discussed with Juliann Pulse of [facility], she will f/u with daughter at [phone].

## 2017-10-14 ENCOUNTER — Encounter (HOSPITAL_COMMUNITY): Payer: Medicare Other

## 2017-10-14 ENCOUNTER — Ambulatory Visit: Payer: Medicare Other | Admitting: Family

## 2017-12-24 ENCOUNTER — Ambulatory Visit: Payer: Medicare Other | Admitting: Family

## 2017-12-24 ENCOUNTER — Encounter: Payer: Self-pay | Admitting: Family

## 2017-12-24 ENCOUNTER — Ambulatory Visit (HOSPITAL_COMMUNITY)
Admission: RE | Admit: 2017-12-24 | Discharge: 2017-12-24 | Disposition: A | Discharge status: Home / Self Care | Payer: Medicare Other | Source: Ambulatory Visit | Attending: Family | Admitting: Family

## 2017-12-24 VITALS — BP 176/81 | HR 69 | Temp 96.9°F | Resp 16 | Ht 61.0 in | Wt 111.0 lb

## 2017-12-24 DIAGNOSIS — Z9889 Other specified postprocedural states: Secondary | ICD-10-CM | POA: Diagnosis not present

## 2017-12-24 DIAGNOSIS — I6523 Occlusion and stenosis of bilateral carotid arteries: Secondary | ICD-10-CM

## 2017-12-24 DIAGNOSIS — Z959 Presence of cardiac and vascular implant and graft, unspecified: Secondary | ICD-10-CM | POA: Insufficient documentation

## 2017-12-24 DIAGNOSIS — I499 Cardiac arrhythmia, unspecified: Secondary | ICD-10-CM | POA: Diagnosis not present

## 2017-12-24 NOTE — Patient Instructions (Signed)

## 2017-12-24 NOTE — Progress Notes (Signed)
Chief Complaint: Follow up Extracranial Carotid Artery Stenosis   History of Present Illness  Abigail [name] is a 84 y.o. female who is status post left carotid endarterectomy by Dr. Drucie Opitz in 2009 and required a stent in the left common carotid artery in 2010. Dr. Kellie Simmering then resumed her care after Dr. Amedeo Plenty departure.   She had a TIA in January, 2015, hospitalized at [facility], as manifested by left hemiparesis, left facial drooping, slurred speech; these symptoms resolved in about a month; patient denies any history of monocular blindness; daughter states her blood pressure was a little high at that time, has since decreased; daughter states that her mother followed up with her PCP following this. The patient denies tingling, numbness, weakness, or pain in either hand or arm.  She walks with a rolling walker, denies problems in her legs with walking.   She has a remote history of left mastectomy, had radiation and chemotherapy afterward.   The patient states she has a little problem with memory.   Diabetic: No Tobacco use: non-smoker  Pt meds include: Statin : yes ASA: Yes Other anticoagulants/antiplatelets: no   Past Medical History:  Diagnosis Date  . Arthritis   . Cancer ([place])    breast  . CVA (cerebral infarction)   . Stroke [facility]) Jan. 2015   Right side    Social History Social History   Tobacco Use  . Smoking status: Never Smoker  . Smokeless tobacco: Never Used  Substance Use Topics  . Alcohol use: No  . Drug use: No    Family History Family History  Problem Relation Age of Onset  . Cancer Father        Colon  . Cancer Sister        Ovarian    Surgical History Past Surgical History:  Procedure Laterality Date  . CAROTID ENDARTERECTOMY     left  . MASTECTOMY Left   . spinal disk surgery    . TUBAL LIGATION      Allergies  Allergen Reactions  . Erythromycin Rash    Current Outpatient Medications  Medication Sig  Dispense Refill  . aspirin 81 MG tablet Take 81 mg by mouth daily.    Marland Kitchen atorvastatin (LIPITOR) 10 MG tablet Take 10 mg by mouth daily at 6 PM.   3  . calcium acetate (PHOSLO) 667 MG capsule Take 600 mg by mouth daily.    . citalopram (CELEXA) 10 MG tablet Take 10 mg by mouth daily.   2  . Cranberry 125 MG TABS Take 1 tablet by mouth at bedtime.    . donepezil (ARICEPT) 10 MG tablet Take 10 mg by mouth every evening.    . Multiple Vitamin (MULTIVITAMIN) tablet Take 1 tablet by mouth daily. 50 Plus    . vitamin E 400 UNIT capsule Take 400 Units by mouth daily.     No current facility-administered medications for this visit.     Review of Systems : See HPI for pertinent positives and negatives.  Physical Examination  Vitals:   12/24/17 1516  BP: (!) 176/81  Pulse: 69  Resp: 16  Temp: (!) 96.9 F (36.1 C)  TempSrc: Oral  SpO2: 97%  Weight: 111 lb (50.3 kg)  Height: 5\' 1"  (1.549 m)   Body mass index is 20.97 kg/m.  General: WDWN elderly female in NAD GAIT:slow, steady, using a rolling walker HENT: no gross abnormalities  Eyes: PERRLA Pulmonary: Respirations are non-labored, CTAB, good air movement in  all fields. Cardiac: Irregular rhythm,no detected murmur.  VASCULAR EXAM Carotid Bruits Right Left   Negative Positive    Abdominal aortic pulse is not palpable. Radial pulses: right is 2+ palpable bilaterally      LE Pulses Right Left   POPLITEAL not palpable  not palpable   POSTERIOR TIBIAL not palpable  not palpable    DORSALIS PEDIS  ANTERIOR TIBIAL faintly palpable  2+ palpable     Gastrointestinal: soft, nontender, BS WNL, no r/g,no palpated masses. Musculoskeletal: No muscle atrophy/wasting. M/S 5/5 throughout, Extremities without ischemic changes. Moderate thoracic kyphosis.  Skin: No  rashes, no ulcers, no cellulitis.   Neurologic:  A&O X 3; appropriate affect, sensation is normal; speech is normal, CN 2-12 intact except is hard of hearing, pain and light touch intact in extremities, motor exam as listed above. Psychiatric: Normal thought content, mood appropriate to clinical situation.    Assessment: Abigail [name] is a 84 y.o. female who is status post left carotid endarterectomy by Dr. Drucie Opitz in 2009 and required a stent in the left common carotid artery in 2010.  She had a TIA in January, 2015, no subsequent TIA or stroke.  Fortunately she does not have DM and has never used tobacco.   She has no known history of cardiac arrhythmia, her cardiac rhythm is irregular now.  She takes a daily 81 mg ASA and a statin, is not on an anticoagulant. She is scheduled to see her PCP next month; her daughter is with her in the exam room, is [employer] clinical [employer] banker, corporate; she will discuss this with pt's PCP.  Pt is asymptomatic of the cardiac arrhythmia, denies chest pain or dyspnea, denies feeling any more tired than usual, denies syncopal or pre-syncopal type episodes, except her daughter states that she will occasionally seem to list to one side when standing at a counter.     DATA Carotid Duplex (12/24/17): Right ICA: 1-39% stenosis Left ICA: CEA site, stent, no restenosis Right vertebral  Artery flow is antegrade, left is retrograde.  Right subclavian artery waveforms are normal, left are monophasic. No significant change compared to the exams on 06-15-14, 08-12-15, and 10-09-16.     Plan: Follow-up in 18 months with Carotid Duplex scan.   I discussed in depth with the patient the nature of atherosclerosis, and emphasized the importance of maximal medical management including strict control of blood pressure, blood glucose, and lipid levels, obtaining regular exercise, and continued cessation of smoking.  The patient is aware that without maximal medical management the  underlying atherosclerotic disease process will progress, limiting the benefit of any interventions. The patient was given information about stroke prevention and what symptoms should prompt the patient to seek immediate medical care. Thank you for allowing [place] to participate in this patient's care.  Clemon Chambers, RN, MSN, FNP-C [facility] Office: [phone]  Clinic Physician: Early  12/24/17 3:38 PM

## 2018-11-23 IMAGING — DX DG CHEST 2V
2 series · 2 of 2 positions shown · non-contrast
Comparison: 04/29/2017

CLINICAL DATA: Cough

EXAM:
CHEST  2 VIEW

[chest pa]
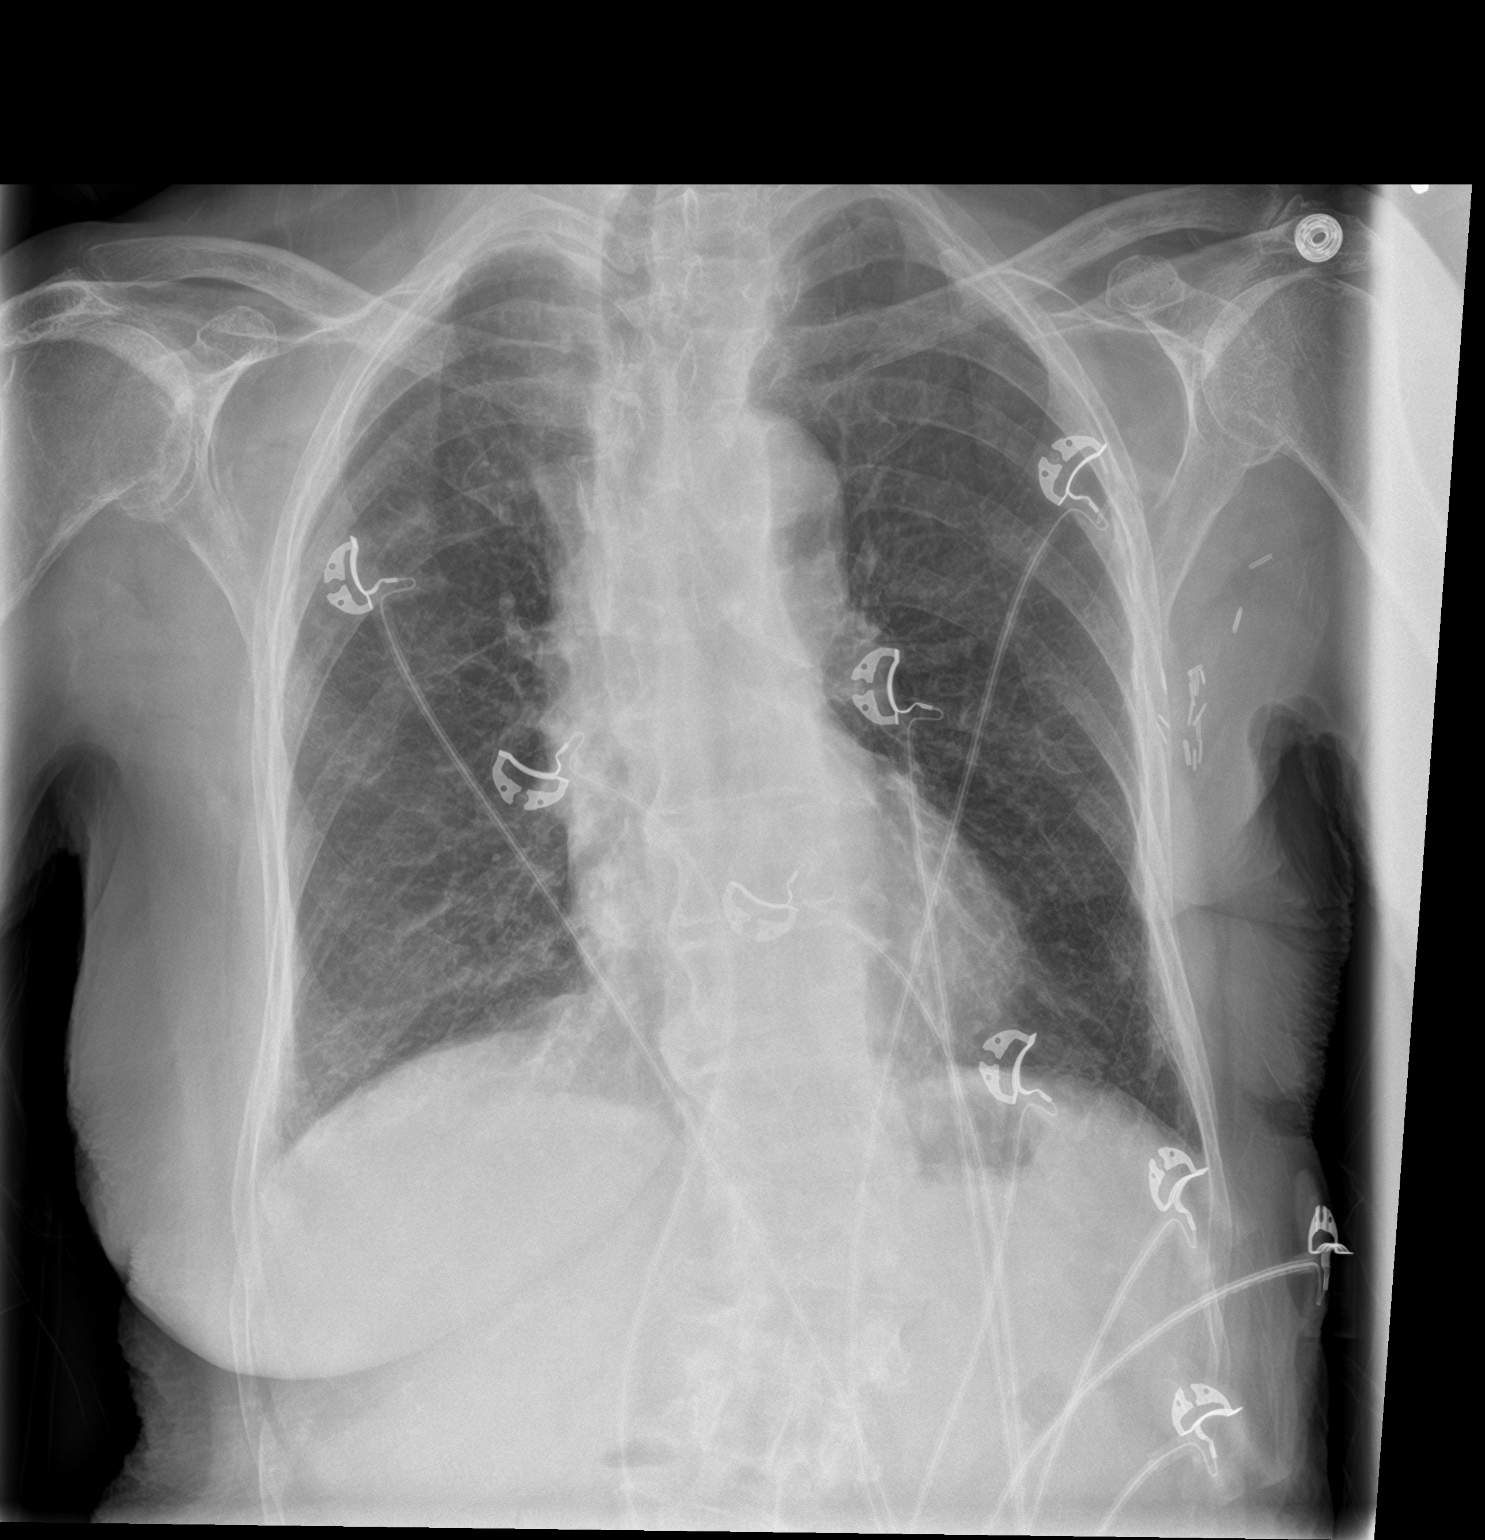

[chest lat]
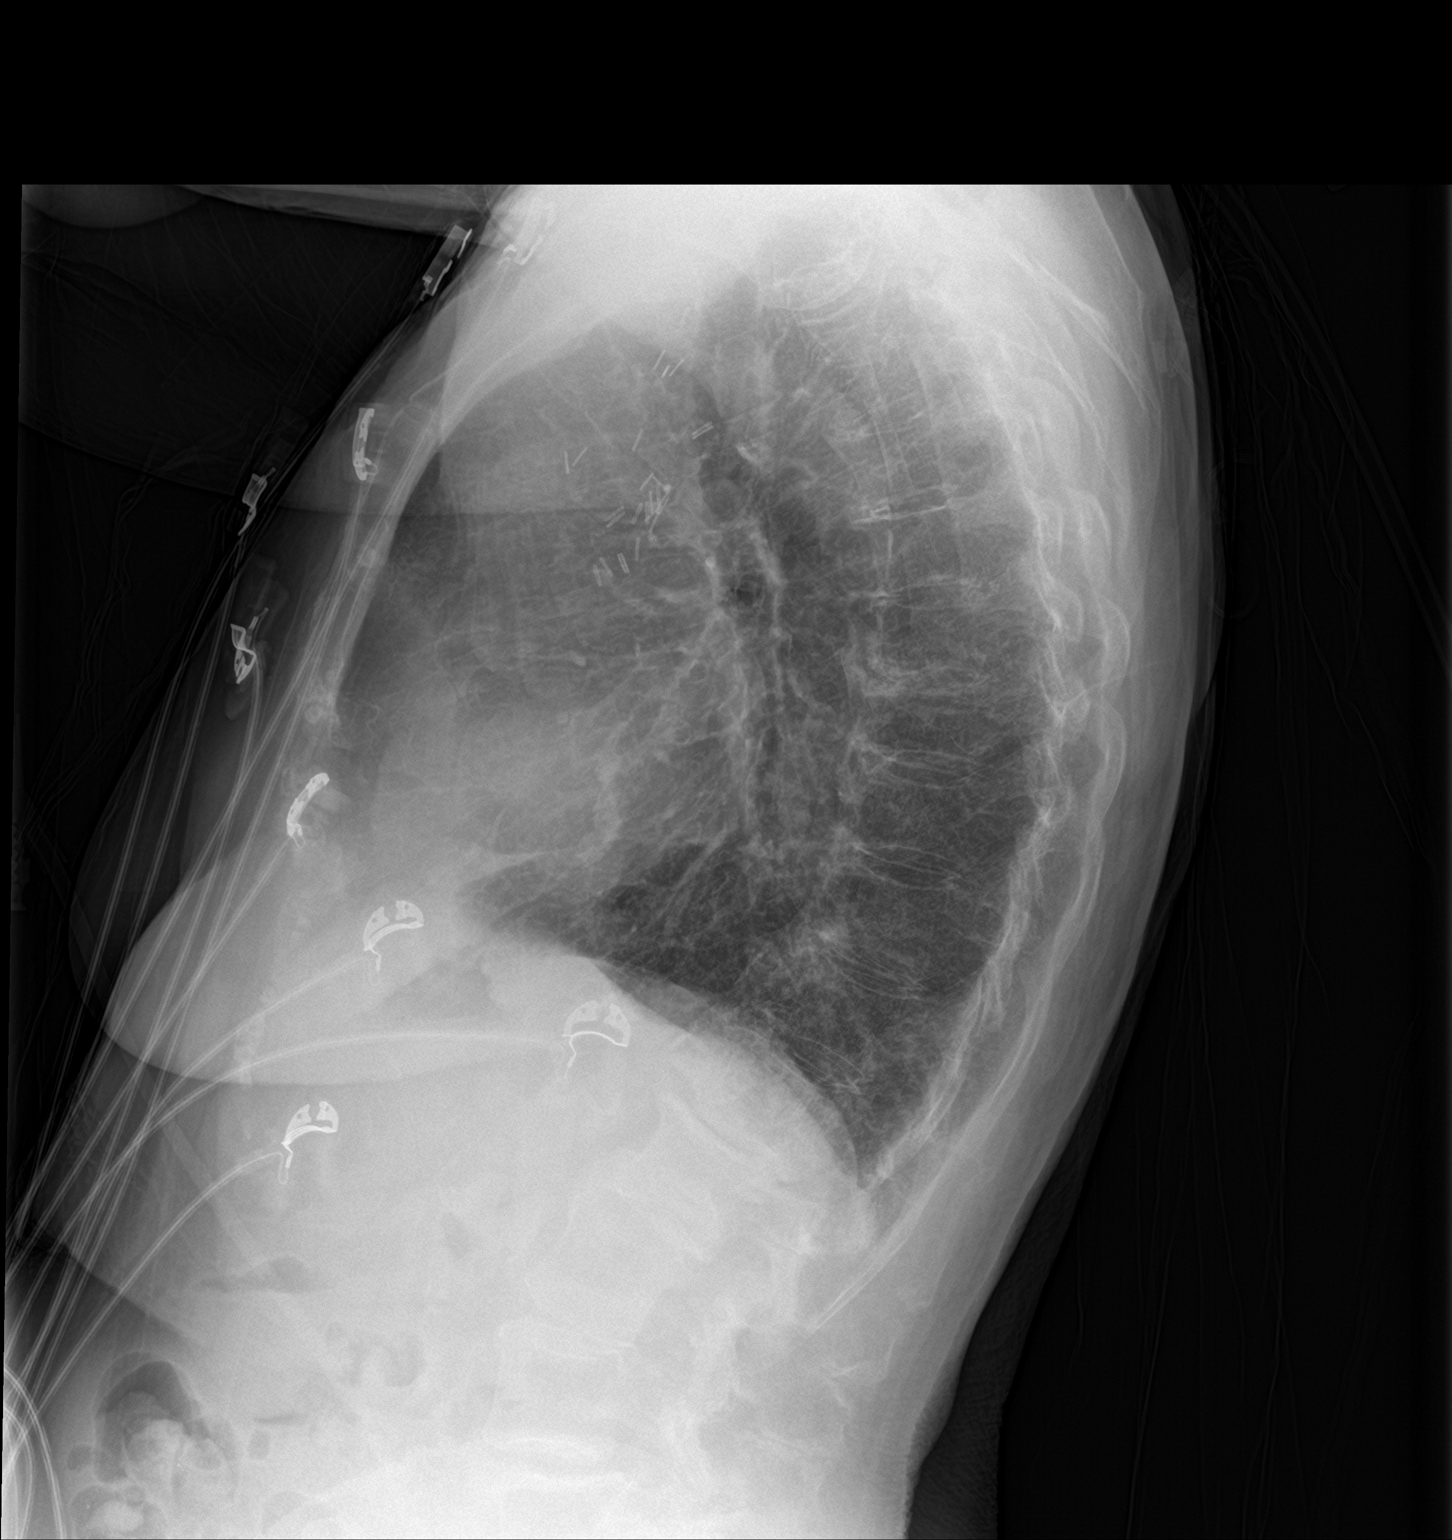

[2 of 2 positions shown; findings below may reference images not displayed]

FINDINGS: Mild patchy right upper lobe and right lower lobe opacities,
pneumonia not excluded.

Left lung is essentially clear. No pleural effusion or pneumothorax.

The heart is normal in size.

Status post left mastectomy with left axillary lymph node
dissection.
IMPRESSION: Mild patchy right upper and lower lobe opacities, pneumonia not
excluded.

## 2020-05-24 ENCOUNTER — Other Ambulatory Visit (INDEPENDENT_AMBULATORY_CARE_PROVIDER_SITE_OTHER): Payer: Self-pay

## 2023-01-12 DEATH — deceased
